# Patient Record
Sex: Female | Born: 1945 | Race: White | Hispanic: No | Marital: Married | Smoking: Never smoker
Health system: Southern US, Community
[De-identification: ages and names within clinical notes are randomized; demographics above are authoritative.]

## PROBLEM LIST (undated history)

## (undated) DIAGNOSIS — C541 Malignant neoplasm of endometrium: Secondary | ICD-10-CM

## (undated) DIAGNOSIS — M545 Low back pain, unspecified: Secondary | ICD-10-CM

## (undated) DIAGNOSIS — E785 Hyperlipidemia, unspecified: Secondary | ICD-10-CM

## (undated) DIAGNOSIS — Z8619 Personal history of other infectious and parasitic diseases: Secondary | ICD-10-CM

## (undated) DIAGNOSIS — M8580 Other specified disorders of bone density and structure, unspecified site: Secondary | ICD-10-CM

## (undated) DIAGNOSIS — K7689 Other specified diseases of liver: Secondary | ICD-10-CM

## (undated) DIAGNOSIS — Z87898 Personal history of other specified conditions: Secondary | ICD-10-CM

## (undated) DIAGNOSIS — K635 Polyp of colon: Secondary | ICD-10-CM

## (undated) HISTORY — DX: Other specified disorders of bone density and structure, unspecified site: M85.80

## (undated) HISTORY — DX: Polyp of colon: K63.5

## (undated) HISTORY — DX: Personal history of other infectious and parasitic diseases: Z86.19

## (undated) HISTORY — DX: Low back pain: M54.5

## (undated) HISTORY — PX: OOPHORECTOMY: SHX86

## (undated) HISTORY — DX: Low back pain, unspecified: M54.50

## (undated) HISTORY — DX: Other specified diseases of liver: K76.89

## (undated) HISTORY — DX: Malignant neoplasm of endometrium: C54.1

## (undated) HISTORY — DX: Hyperlipidemia, unspecified: E78.5

## (undated) HISTORY — DX: Personal history of other specified conditions: Z87.898

---

## 1994-10-05 DIAGNOSIS — C541 Malignant neoplasm of endometrium: Secondary | ICD-10-CM

## 1994-10-05 HISTORY — PX: VAGINAL HYSTERECTOMY: SUR661

## 1994-10-05 HISTORY — DX: Malignant neoplasm of endometrium: C54.1

## 1998-12-31 ENCOUNTER — Other Ambulatory Visit: Admission: RE | Admit: 1998-12-31 | Discharge: 1998-12-31 | Payer: Self-pay | Admitting: Gynecology

## 1999-07-01 ENCOUNTER — Other Ambulatory Visit: Admission: RE | Admit: 1999-07-01 | Discharge: 1999-07-01 | Payer: Self-pay | Admitting: Gynecology

## 2000-03-02 ENCOUNTER — Other Ambulatory Visit: Admission: RE | Admit: 2000-03-02 | Discharge: 2000-03-02 | Payer: Self-pay | Admitting: Gynecology

## 2000-09-09 ENCOUNTER — Other Ambulatory Visit: Admission: RE | Admit: 2000-09-09 | Discharge: 2000-09-09 | Payer: Self-pay | Admitting: Gynecology

## 2002-10-25 ENCOUNTER — Other Ambulatory Visit: Admission: RE | Admit: 2002-10-25 | Discharge: 2002-10-25 | Payer: Self-pay | Admitting: Gynecology

## 2003-10-29 ENCOUNTER — Other Ambulatory Visit: Admission: RE | Admit: 2003-10-29 | Discharge: 2003-10-29 | Payer: Self-pay | Admitting: Gynecology

## 2004-11-14 ENCOUNTER — Other Ambulatory Visit: Admission: RE | Admit: 2004-11-14 | Discharge: 2004-11-14 | Payer: Self-pay | Admitting: Gynecology

## 2005-04-28 ENCOUNTER — Ambulatory Visit (HOSPITAL_COMMUNITY): Admission: RE | Admit: 2005-04-28 | Discharge: 2005-04-28 | Payer: Self-pay | Admitting: Pulmonary Disease

## 2005-04-28 ENCOUNTER — Ambulatory Visit: Payer: Self-pay | Admitting: Pulmonary Disease

## 2005-08-31 ENCOUNTER — Ambulatory Visit: Payer: Self-pay | Admitting: Pulmonary Disease

## 2005-11-16 ENCOUNTER — Other Ambulatory Visit: Admission: RE | Admit: 2005-11-16 | Discharge: 2005-11-16 | Payer: Self-pay | Admitting: Gynecology

## 2006-02-01 ENCOUNTER — Ambulatory Visit (HOSPITAL_COMMUNITY): Admission: RE | Admit: 2006-02-01 | Discharge: 2006-02-01 | Payer: Self-pay | Admitting: Pulmonary Disease

## 2006-02-01 ENCOUNTER — Ambulatory Visit: Payer: Self-pay | Admitting: Pulmonary Disease

## 2006-02-02 ENCOUNTER — Ambulatory Visit (HOSPITAL_COMMUNITY): Admission: RE | Admit: 2006-02-02 | Discharge: 2006-02-02 | Payer: Self-pay | Admitting: Pulmonary Disease

## 2006-02-02 ENCOUNTER — Encounter: Payer: Self-pay | Admitting: Vascular Surgery

## 2006-05-25 ENCOUNTER — Ambulatory Visit: Payer: Self-pay | Admitting: Pulmonary Disease

## 2006-09-01 ENCOUNTER — Ambulatory Visit: Payer: Self-pay | Admitting: Pulmonary Disease

## 2006-09-01 LAB — CONVERTED CEMR LAB
Basophils Relative: 0.6 % (ref 0.0–1.0)
Bilirubin Urine: NEGATIVE
Chloride: 105 meq/L (ref 96–112)
Cholesterol: 171 mg/dL (ref 0–200)
Creatinine, Ser: 0.9 mg/dL (ref 0.4–1.2)
Glucose, Bld: 101 mg/dL — ABNORMAL HIGH (ref 70–99)
HCT: 45.3 % (ref 36.0–46.0)
LDL Cholesterol: 103 mg/dL — ABNORMAL HIGH (ref 0–99)
MCHC: 34 g/dL (ref 30.0–36.0)
MCV: 90.4 fL (ref 78.0–100.0)
Monocytes Absolute: 0.6 10*3/uL (ref 0.2–0.7)
Monocytes Relative: 7.4 % (ref 3.0–11.0)
Neutro Abs: 5.4 10*3/uL (ref 1.4–7.7)
Nitrite: NEGATIVE
RBC / HPF: NONE SEEN
RBC: 5.01 M/uL (ref 3.87–5.11)
RDW: 11.9 % (ref 11.5–14.6)
Specific Gravity, Urine: 1.01 (ref 1.000–1.03)
TSH: 1.29 microintl units/mL (ref 0.35–5.50)
Total Protein, Urine: NEGATIVE mg/dL
Total Protein: 7.3 g/dL (ref 6.0–8.3)
Triglyceride fasting, serum: 116 mg/dL (ref 0–149)
Urine Glucose: NEGATIVE mg/dL
VLDL: 23 mg/dL (ref 0–40)
pH: 7.5 (ref 5.0–8.0)

## 2006-11-17 ENCOUNTER — Other Ambulatory Visit: Admission: RE | Admit: 2006-11-17 | Discharge: 2006-11-17 | Payer: Self-pay | Admitting: Gynecology

## 2006-11-22 ENCOUNTER — Encounter: Payer: Self-pay | Admitting: Pulmonary Disease

## 2006-12-14 ENCOUNTER — Ambulatory Visit: Payer: Self-pay | Admitting: Pulmonary Disease

## 2006-12-14 LAB — CONVERTED CEMR LAB
Bilirubin Urine: NEGATIVE
Ketones, ur: NEGATIVE mg/dL
Nitrite: NEGATIVE
pH: 7 (ref 5.0–8.0)

## 2007-06-21 ENCOUNTER — Ambulatory Visit: Payer: Self-pay | Admitting: Pulmonary Disease

## 2007-09-06 ENCOUNTER — Encounter: Payer: Self-pay | Admitting: Pulmonary Disease

## 2007-09-16 ENCOUNTER — Telehealth (INDEPENDENT_AMBULATORY_CARE_PROVIDER_SITE_OTHER): Payer: Self-pay | Admitting: *Deleted

## 2007-10-10 ENCOUNTER — Ambulatory Visit: Payer: Self-pay | Admitting: Pulmonary Disease

## 2007-10-10 DIAGNOSIS — J309 Allergic rhinitis, unspecified: Secondary | ICD-10-CM | POA: Insufficient documentation

## 2007-10-10 DIAGNOSIS — E785 Hyperlipidemia, unspecified: Secondary | ICD-10-CM

## 2007-10-12 ENCOUNTER — Encounter: Payer: Self-pay | Admitting: Pulmonary Disease

## 2007-11-21 ENCOUNTER — Other Ambulatory Visit: Admission: RE | Admit: 2007-11-21 | Discharge: 2007-11-21 | Payer: Self-pay | Admitting: Gynecology

## 2007-12-09 ENCOUNTER — Ambulatory Visit: Payer: Self-pay | Admitting: Pulmonary Disease

## 2007-12-09 DIAGNOSIS — M545 Low back pain: Secondary | ICD-10-CM

## 2007-12-09 DIAGNOSIS — M949 Disorder of cartilage, unspecified: Secondary | ICD-10-CM

## 2007-12-09 DIAGNOSIS — K759 Inflammatory liver disease, unspecified: Secondary | ICD-10-CM | POA: Insufficient documentation

## 2007-12-09 DIAGNOSIS — M899 Disorder of bone, unspecified: Secondary | ICD-10-CM | POA: Insufficient documentation

## 2007-12-10 LAB — CONVERTED CEMR LAB
ALT: 38 units/L — ABNORMAL HIGH (ref 0–35)
Albumin: 4.2 g/dL (ref 3.5–5.2)
Alkaline Phosphatase: 101 units/L (ref 39–117)
BUN: 16 mg/dL (ref 6–23)
Bacteria, UA: NEGATIVE
Basophils Absolute: 0.1 10*3/uL (ref 0.0–0.1)
Bilirubin, Direct: 0.1 mg/dL (ref 0.0–0.3)
CO2: 31 meq/L (ref 19–32)
Chloride: 103 meq/L (ref 96–112)
GFR calc Af Amer: 93 mL/min
GFR calc non Af Amer: 77 mL/min
HCT: 43.3 % (ref 36.0–46.0)
HDL: 37.3 mg/dL — ABNORMAL LOW (ref >39.0)
Hemoglobin: 14.9 g/dL (ref 12.0–15.0)
LDL Cholesterol: 104 mg/dL — ABNORMAL HIGH (ref 0–99)
Leukocytes, UA: NEGATIVE
Lymphocytes Relative: 23.1 % (ref 12.0–46.0)
MCV: 89.2 fL (ref 78.0–100.0)
Monocytes Absolute: 0.7 10*3/uL (ref 0.2–0.7)
Mucus, UA: NEGATIVE
Nitrite: NEGATIVE
Platelets: 294 10*3/uL (ref 150–400)
Potassium: 4.1 meq/L (ref 3.5–5.1)
RBC / HPF: NONE SEEN
TSH: 1.26 microintl units/mL (ref 0.35–5.50)
Total CHOL/HDL Ratio: 4.7
Total Protein, Urine: NEGATIVE mg/dL
Total Protein: 7.1 g/dL (ref 6.0–8.3)
Triglycerides: 172 mg/dL — ABNORMAL HIGH (ref 0–149)
Urine Glucose: NEGATIVE mg/dL
Urobilinogen, UA: 0.2 (ref 0.0–1.0)
VLDL: 34 mg/dL (ref 0–40)

## 2007-12-28 ENCOUNTER — Ambulatory Visit: Payer: Self-pay | Admitting: Pulmonary Disease

## 2007-12-28 LAB — CONVERTED CEMR LAB
OCCULT 2: NEGATIVE
OCCULT 4: NEGATIVE

## 2008-04-19 ENCOUNTER — Telehealth (INDEPENDENT_AMBULATORY_CARE_PROVIDER_SITE_OTHER): Payer: Self-pay | Admitting: *Deleted

## 2008-05-09 ENCOUNTER — Encounter: Payer: Self-pay | Admitting: Pulmonary Disease

## 2008-08-01 ENCOUNTER — Ambulatory Visit: Payer: Self-pay | Admitting: Pulmonary Disease

## 2008-08-17 ENCOUNTER — Telehealth (INDEPENDENT_AMBULATORY_CARE_PROVIDER_SITE_OTHER): Payer: Self-pay | Admitting: *Deleted

## 2008-09-12 ENCOUNTER — Encounter: Payer: Self-pay | Admitting: Pulmonary Disease

## 2009-01-02 ENCOUNTER — Ambulatory Visit: Payer: Self-pay | Admitting: Pulmonary Disease

## 2009-01-02 LAB — CONVERTED CEMR LAB: Streptococcus, Group A Screen (Direct): NEGATIVE

## 2009-01-03 ENCOUNTER — Telehealth (INDEPENDENT_AMBULATORY_CARE_PROVIDER_SITE_OTHER): Payer: Self-pay | Admitting: *Deleted

## 2009-02-11 ENCOUNTER — Ambulatory Visit: Payer: Self-pay | Admitting: Gynecology

## 2009-02-11 ENCOUNTER — Other Ambulatory Visit: Admission: RE | Admit: 2009-02-11 | Discharge: 2009-02-11 | Payer: Self-pay | Admitting: Gynecology

## 2009-02-11 ENCOUNTER — Encounter: Payer: Self-pay | Admitting: Gynecology

## 2009-02-27 ENCOUNTER — Ambulatory Visit: Payer: Self-pay | Admitting: Pulmonary Disease

## 2009-03-03 LAB — CONVERTED CEMR LAB
ALT: 30 units/L (ref 0–35)
BUN: 14 mg/dL (ref 6–23)
Basophils Absolute: 0 10*3/uL (ref 0.0–0.1)
Bilirubin Urine: NEGATIVE
CO2: 30 meq/L (ref 19–32)
Calcium: 9.8 mg/dL (ref 8.4–10.5)
Creatinine, Ser: 0.7 mg/dL (ref 0.4–1.2)
Eosinophils Absolute: 0.1 10*3/uL (ref 0.0–0.7)
Glucose, Bld: 89 mg/dL (ref 70–99)
HCT: 44.6 % (ref 36.0–46.0)
Hemoglobin, Urine: NEGATIVE
Hemoglobin: 15.5 g/dL — ABNORMAL HIGH (ref 12.0–15.0)
Ketones, ur: NEGATIVE mg/dL
MCHC: 34.8 g/dL (ref 30.0–36.0)
MCV: 90.7 fL (ref 78.0–100.0)
Monocytes Absolute: 0.4 10*3/uL (ref 0.1–1.0)
Neutro Abs: 4.6 10*3/uL (ref 1.4–7.7)
Neutrophils Relative %: 59.3 % (ref 43.0–77.0)
Platelets: 277 10*3/uL (ref 150.0–400.0)
RBC: 4.92 M/uL (ref 3.87–5.11)
TSH: 1.55 microintl units/mL (ref 0.35–5.50)
Total Protein, Urine: NEGATIVE mg/dL
Triglycerides: 146 mg/dL (ref 0.0–149.0)
Urine Glucose: NEGATIVE mg/dL
Vit D, 25-Hydroxy: 33 ng/mL (ref 30–89)
WBC: 7.6 10*3/uL (ref 4.5–10.5)

## 2009-04-02 ENCOUNTER — Encounter: Payer: Self-pay | Admitting: Pulmonary Disease

## 2009-04-02 ENCOUNTER — Ambulatory Visit: Payer: Self-pay | Admitting: Gynecology

## 2009-04-17 ENCOUNTER — Telehealth: Payer: Self-pay | Admitting: Pulmonary Disease

## 2009-10-16 ENCOUNTER — Encounter: Payer: Self-pay | Admitting: Pulmonary Disease

## 2010-02-28 ENCOUNTER — Ambulatory Visit: Payer: Self-pay | Admitting: Gynecology

## 2010-02-28 ENCOUNTER — Ambulatory Visit: Payer: Self-pay | Admitting: Pulmonary Disease

## 2010-02-28 ENCOUNTER — Other Ambulatory Visit: Admission: RE | Admit: 2010-02-28 | Discharge: 2010-02-28 | Payer: Self-pay | Admitting: Gynecology

## 2010-03-01 LAB — CONVERTED CEMR LAB
ALT: 31 units/L (ref 0–35)
AST: 23 units/L (ref 0–37)
Alkaline Phosphatase: 96 units/L (ref 39–117)
BUN: 18 mg/dL (ref 6–23)
Basophils Relative: 0.4 % (ref 0.0–3.0)
Bilirubin Urine: NEGATIVE
Bilirubin, Direct: 0.1 mg/dL (ref 0.0–0.3)
CO2: 30 meq/L (ref 19–32)
Chloride: 105 meq/L (ref 96–112)
Creatinine, Ser: 0.7 mg/dL (ref 0.4–1.2)
Eosinophils Relative: 1 % (ref 0.0–5.0)
GFR calc non Af Amer: 89.15 mL/min (ref >60)
HDL: 42.2 mg/dL (ref >39.00)
Hemoglobin, Urine: NEGATIVE
Lymphocytes Relative: 25.1 % (ref 12.0–46.0)
Lymphs Abs: 2.7 10*3/uL (ref 0.7–4.0)
MCV: 92.3 fL (ref 78.0–100.0)
Monocytes Relative: 6.5 % (ref 3.0–12.0)
Nitrite: NEGATIVE
Platelets: 291 10*3/uL (ref 150.0–400.0)
Potassium: 4.3 meq/L (ref 3.5–5.1)
RBC: 4.8 M/uL (ref 3.87–5.11)
Specific Gravity, Urine: 1.025 (ref 1.000–1.030)
TSH: 1.61 microintl units/mL (ref 0.35–5.50)
Total Bilirubin: 0.5 mg/dL (ref 0.3–1.2)
Total CHOL/HDL Ratio: 4
Total Protein, Urine: NEGATIVE mg/dL
Triglycerides: 159 mg/dL — ABNORMAL HIGH (ref 0.0–149.0)
pH: 6 (ref 5.0–8.0)

## 2010-09-04 ENCOUNTER — Telehealth (INDEPENDENT_AMBULATORY_CARE_PROVIDER_SITE_OTHER): Payer: Self-pay | Admitting: *Deleted

## 2010-09-09 ENCOUNTER — Telehealth: Payer: Self-pay | Admitting: Pulmonary Disease

## 2010-09-23 ENCOUNTER — Ambulatory Visit: Payer: Self-pay | Admitting: Pulmonary Disease

## 2010-10-28 ENCOUNTER — Ambulatory Visit
Admission: RE | Admit: 2010-10-28 | Discharge: 2010-10-28 | Payer: Self-pay | Source: Home / Self Care | Attending: Pulmonary Disease | Admitting: Pulmonary Disease

## 2010-10-28 DIAGNOSIS — I10 Essential (primary) hypertension: Secondary | ICD-10-CM | POA: Insufficient documentation

## 2010-11-06 NOTE — Progress Notes (Signed)
Summary: ? reaction to losartan  Phone Note Call from Patient Call back at [facility] Phone [phone]   Caller: Patient Call For: nadel Summary of Call: Pt states she can take losartan re: severe reaction to her kidneys.//[facility] [address], [place] Initial call taken by: Darletta Moll,  September 09, 2010 8:27 AM  Follow-up for Phone Call        called spoke with patient who states that she took 1st dose of the losartan on 12.3.11 and states that she had increased and painful urination x6hours.  states she has not taken the losartan since.  states no further urinary symptoms.  would like to change meds.  please advise, thanks!  [facility]  allergies: fosamax. Follow-up by: Boone Master CNA/MA,  September 09, 2010 9:37 AM  Additional Follow-up for Phone Call Additional follow up Details #1::        per SN---very unusual ---never seen this before from losartan----rec to change to metoprolol er 50mg  once daily--and she will keep her appt in jan with SN--this has been called in to the [facility] on [address]---[phone]. Randell Loop CMA  September 09, 2010 4:16 PM     New/Updated Medications: METOPROLOL TARTRATE 50 MG TABS (METOPROLOL TARTRATE) take one tablet by mouth once daily

## 2010-11-06 NOTE — Assessment & Plan Note (Signed)
Summary: cough,congestion/mhh   Visit Type:  NP sick visit Primary Provider/Referring Provider:  Alroy Dust, MD  CC:  Pt c/o productive cough with small amounts of thick yellow mucus, wheezing, epigastric pain only when coughing, headaches. Has been using OTC sudafed, nedi pot, Mucinex DM, NyQuil PM, and cough drops with temp relief.  History of Present Illness: 65 y/o WF  with known history of Hyperlipidemia and osteopenia.   January 07, 2009--Presents for an acute office visit. Complains of sore throat on left side x2 days . Complains of nasal stuffiness, mild drainage. Denies chest pain, dyspnea, orthopnea, hemoptysis, fever, n/v/d, edema, headache,recent travel or antibiotics, no discolored mucous. No otc used.   September 23, 2010--Presents for an acute office visit. Complains of productive cough with small amounts of thick yellow mucus, wheezing, epigastric pain only when coughing, headaches. Has been using OTC sudafed, nedi pot, Mucinex DM, NyQuil PM, cough drops with temp relief. Does not want to be sick for the holidays.Denies chest pain, dyspnea, orthopnea, hemoptysis, fever, n/v/d, edema, headache,recent travel or antibiotics.    Preventive Screening-Counseling & Management  Alcohol-Tobacco     Smoking Status: never  Current Medications (verified): 1)  Low-Dose Aspirin 81 Mg  Tabs (Aspirin) .... Take 1 Tablet By Mouth Once A Day 2)  Simvastatin 40 Mg Tabs (Simvastatin) .... Take 1 Tablet By Mouth At Bedtime 3)  Caltrate 600+d 600-400 Mg-Unit  Tabs (Calcium Carbonate-Vitamin D) .... Take 1 Tab By Mouth Two Times A Day... 4)  Multivitamins   Tabs (Multiple Vitamin) .... Take 1 Tablet By Mouth Once A Day 5)  Vitamin D 1000 Unit Tabs (Cholecalciferol) .... Take 1 Cap By Mouth Once Daily.Marland KitchenMarland Kitchen 6)  Metoprolol Tartrate 50 Mg Tabs (Metoprolol Tartrate) .... Take One Tablet By Mouth Once Daily  Allergies: 1)  ! Fosamax (Alendronate Sodium) 2)  ! Losartan Potassium (Losartan  Potassium)  Past History:  Past Medical History: Last updated: 02/28/2010 ALLERGIC RHINITIS (ICD-477.9) Hx of DIZZINESS (ICD-780.4) HYPERLIPIDEMIA (ICD-272.4) Hx of HEPATITIS (ICD-573.3) Hx of LUMBAGO (ICD-724.2) OSTEOPENIA (ICD-733.90)  Past Surgical History: Last updated: 02/28/2010 S/P hysterectomy  Family History: Last updated: 2009-03-27 Father died age 88 from cancer- ?type ("he had a wen on his arm that turned into cancer") Mother alive age 38 in good health x detached retina 1 Sibling= Sister w/ hx breast cancer  Social History: Last updated: 2009/03/27 Married, husb= Perlie Gold, 44 yrs 1 child= son age 90 in good health never smoked social alcohol retired from [employer]  Risk Factors: Smoking Status: never (09/23/2010)  Review of Systems      See HPI  Vital Signs:  Patient profile:   65 year old female Height:      62.5 inches Weight:      188 pounds BMI:     33.96 O2 Sat:      95 % on Room air Temp:     99.5 degrees F oral Pulse rate:   71 / minute BP sitting:   118 / 78  (left arm) Cuff size:   large  Vitals Entered By: Zackery Barefoot CMA (September 23, 2010 11:39 AM)  O2 Flow:  Room air CC: Pt c/o productive cough with small amounts of thick yellow mucus, wheezing, epigastric pain only when coughing, headaches. Has been using OTC sudafed, nedi pot, Mucinex DM, NyQuil PM, cough drops with temp relief Is Patient Diabetic? No Comments Medications reviewed with patient Verified contact number and pharmacy with patient Zackery Barefoot [facility]  September 23, 2010 11:39  AM    Physical Exam  Additional Exam:  WD, Overweight, 65 y/o WF in NAD... GENERAL:  Alert & oriented; pleasant & cooperative. HEENT:  /AT, EOM-wnl,  EACs-clear, TMs-wnl, NOSE-clear, THROAT-mild redness, no exudate NECK:  Supple w/ full ROM; no JVD; normal carotid impulses w/o bruits; no thyromegaly or nodules palpated; no lymphadenopathy. CHEST:  Coarse BS w/ no wheezing  HEART:   Regular Rhythm; without murmurs/ rubs/ or gallops. ABDOMEN:  Soft & nontender; normal bowel sounds; no organomegaly or masses detected. EXT: without deformities or arthritic changes; no varicose veins/ +venous insuffic/ no edema.     Impression & Recommendations:  Problem # 1:  BRONCHITIS, ACUTE (ICD-466.0)  Zithromax 500mg  once daily for 5 days  Mucinex DM two times a day as needed cough/congestion  Increase fluids and rest.  Saline nasal rinses as needed  Hydromet 1-2 tsp every 4-6 hr as needed cough -may make you sleepy Please contact office for sooner follow up if symptoms do not improve or worsen  Her updated medication list for this problem includes:    Azithromycin 500 Mg Tabs (Azithromycin) .Marland Kitchen... 1 by mouth once daily    Hydromet 5-1.5 Mg/98ml Syrp (Hydrocodone-homatropine) .Marland Kitchen... 1-2 tsp every 4-6 hr as needed cough  Orders: Est. Patient Level III (40981)  Medications Added to Medication List This Visit: 1)  Azithromycin 500 Mg Tabs (Azithromycin) .Marland Kitchen.. 1 by mouth once daily 2)  Hydromet 5-1.5 Mg/48ml Syrp (Hydrocodone-homatropine) .Marland Kitchen.. 1-2 tsp every 4-6 hr as needed cough 3)  Azithromycin 500 Mg Tabs (Azithromycin) .Marland Kitchen.. 1 by mouth once daily  Complete Medication List: 1)  Low-dose Aspirin 81 Mg Tabs (Aspirin) .... Take 1 tablet by mouth once a day 2)  Simvastatin 40 Mg Tabs (Simvastatin) .... Take 1 tablet by mouth at bedtime 3)  Caltrate 600+d 600-400 Mg-unit Tabs (Calcium carbonate-vitamin d) .... Take 1 tab by mouth two times a day... 4)  Multivitamins Tabs (Multiple vitamin) .... Take 1 tablet by mouth once a day 5)  Vitamin D 1000 Unit Tabs (Cholecalciferol) .... Take 1 cap by mouth once daily.Marland KitchenMarland Kitchen 6)  Metoprolol Tartrate 50 Mg Tabs (Metoprolol tartrate) .... Take one tablet by mouth once daily 7)  Azithromycin 500 Mg Tabs (Azithromycin) .Marland Kitchen.. 1 by mouth once daily 8)  Hydromet 5-1.5 Mg/20ml Syrp (Hydrocodone-homatropine) .Marland Kitchen.. 1-2 tsp every 4-6 hr as needed cough 9)   Azithromycin 500 Mg Tabs (Azithromycin) .Marland Kitchen.. 1 by mouth once daily  Patient Instructions: 1)  Zithromax 500mg  once daily for 5 days  2)  Mucinex DM two times a day as needed cough/congestion  3)  Increase fluids and rest.  4)  Saline nasal rinses as needed  5)  Hydromet 1-2 tsp every 4-6 hr as needed cough -may make you sleepy 6)  Please contact office for sooner follow up if symptoms do not improve or worsen  Prescriptions: AZITHROMYCIN 500 MG TABS (AZITHROMYCIN) 1 by mouth once daily  #5 x 0   Entered and Authorized by:   Rubye Oaks NP   Signed by:   Keron Neenan NP on 09/23/2010   Method used:   Print then Give to Patient   RxID:   [id number] HYDROMET 5-1.5 MG/5ML SYRP (HYDROCODONE-HOMATROPINE) 1-2 tsp every 4-6 hr as needed cough  #8 oz x 0   Entered and Authorized by:   Rubye Oaks NP   Signed by:   Ellijah Leffel NP on 09/23/2010   Method used:   Print then Give to Patient   RxID:   [id number]  AZITHROMYCIN 500 MG TABS (AZITHROMYCIN) 1 by mouth once daily  #5 x 0   Entered and Authorized by:   Rubye Oaks NP   Signed by:   Rubye Oaks NP on 09/23/2010   Method used:   Electronically to        [facility] [phone]* (retail)       [address]       [place], [place]  [address]       Ph: [phone]       Fax: [phone]   RxID:   [id number]

## 2010-11-06 NOTE — Assessment & Plan Note (Signed)
Summary: 1 yr physical ///kp   CC:  Yearly ROV & CPX....  History of Present Illness: 65 y/o WF here for a follow up visit and CPX... she has been doing well and has no new complaints or concerns today... she notes some intermittent heel pain improved w/ inserts & stretching exercises...   Current Problems:   ALLERGIC RHINITIS (ICD-477.9) - stable, no recent problems, OTC Rx w/ Zyrtek/ Claritin Prn.  Hx of DIZZINESS (ICD-780.4) - no recent problems...  HYPERLIPIDEMIA (ICD-272.4) - on SIMVASTATIN 40mg /d now... tol well.  ~  FLP 11/07 on Lipitor 20mg /d showed TChol 171, TG 116, HDL 45, LDL 103  ~  FLP 3/09 on Simva40 showed TChol 176, TG 172, HDL 37, LDL 104  ~  FLP 5/10 (wt=189#) on Simva40 showed TChol 181, TG 146, HDL 43, LDL 109  ~  FLP 5/11 (wt=189#) on Simva40 showed TChol 172, TG 159, HDL 42, LDL 98  Hx of HEPATITIS (ICD-573.3) - hx jaundice in 8th grade "alot of kids had it"... ? etiology (?HepA?), no known sequellae... all LFT's have been normal...  Hx of LUMBAGO (ICD-724.2) - seen 3/08 w/ LBP... rx'd w/ Robaxin, heat, Vicodin & improved... does exercises to keep in shape... no recent LBP complaints.  OSTEOPENIA (ICD-733.90) - followed by DrFontaine and [facility]... we don't have a copy of her last BMD, but in 2006 her TScores were -1.2 to -2.0.Marland KitchenMarland Kitchen she was intol to Fosamax w/ joint pain that resolved off this Rx... DrFontaine has opted for Ca++, Vits, and observation... we discussed Osteoporosis in detail and all the diff meds and implications for the future... she may wish to try boniva & will discuss this w/ DrFontaine.  ~  labs 3/09 showed Vit D level = 28... rec> start 1000 u OTC daily.  ~  labs 5/10 showed Vit D level = 33... continue supplement.  DERM:  Hx of plantar wart, and poison ivy sensitivity...   Health Maintenance:    ~  GI:  she had routine colonoscopy 11/02 by DrPatterson and it was WNL... f/u planned 65yrs...   ~  GYN:  DrFontaine-  Hx cervical cancer w/  hysterectomy 1996;   +fam hx breast cancer in sis, neg Mammogram 12/09;  BMD at [facility] & followed by GYN/ Fontaine.  ~  Vaccinations:  she gets yearly Flu vaccines;  Pneumovax given 02/27/09;  given TDAP 02/28/10 here;  had Shingles vacc 8/09 at [facility] w/ local reaction on arm...   Allergies: 1)  ! Fosamax (Alendronate Sodium)  Comments:  Nurse/Medical Assistant: The patient's medications and allergies were reviewed with the patient and were updated in the Medication and Allergy Lists.  Past History:  Past Medical History: ALLERGIC RHINITIS (ICD-477.9) Hx of DIZZINESS (ICD-780.4) HYPERLIPIDEMIA (ICD-272.4) Hx of HEPATITIS (ICD-573.3) Hx of LUMBAGO (ICD-724.2) OSTEOPENIA (ICD-733.90)  Past Surgical History: S/P hysterectomy  Family History: Reviewed history from 02/27/2009 and no changes required. Father died age 63 from cancer- ?type ("he had a wen on his arm that turned into cancer") Mother alive age 54 in good health x detached retina 1 Sibling= Sister w/ hx breast cancer  Social History: Reviewed history from 02/27/2009 and no changes required. Married, husb= Perlie Gold, 44 yrs 1 child= son age 64 in good health never smoked social alcohol retired from [employer]  Review of Systems  The patient denies fever, chills, sweats, anorexia, fatigue, weakness, malaise, weight loss, sleep disorder, blurring, diplopia, eye irritation, eye discharge, vision loss, eye pain, photophobia, earache, ear discharge, tinnitus, decreased  hearing, nasal congestion, nosebleeds, sore throat, hoarseness, chest pain, palpitations, syncope, dyspnea on exertion, orthopnea, PND, peripheral edema, cough, dyspnea at rest, excessive sputum, hemoptysis, wheezing, pleurisy, nausea, vomiting, diarrhea, constipation, change in bowel habits, abdominal pain, melena, hematochezia, jaundice, gas/bloating, indigestion/heartburn, dysphagia, odynophagia, dysuria, hematuria, urinary frequency, urinary hesitancy,  nocturia, incontinence, back pain, joint pain, joint swelling, muscle cramps, muscle weakness, stiffness, arthritis, sciatica, restless legs, leg pain at night, leg pain with exertion, rash, itching, dryness, suspicious lesions, paralysis, paresthesias, seizures, tremors, vertigo, transient blindness, frequent falls, frequent headaches, difficulty walking, depression, anxiety, memory loss, confusion, cold intolerance, heat intolerance, polydipsia, polyphagia, polyuria, unusual weight change, abnormal bruising, bleeding, enlarged lymph nodes, urticaria, allergic rash, hay fever, and recurrent infections.    Vital Signs:  Patient profile:   65 year old female Height:      62.5 inches Weight:      189 pounds BMI:     34.14 O2 Sat:      97 % on Room air Temp:     97.2 degrees F oral Pulse rate:   62 / minute BP sitting:   128 / 84  (left arm) Cuff size:   regular  Vitals Entered By: Randell Loop CMA (Feb 28, 2010 8:38 AM)  O2 Sat at Rest %:  97 O2 Flow:  Room air CC: Yearly ROV & CPX... Is Patient Diabetic? No Pain Assessment Patient in pain? no      Comments no changes in meds today   Physical Exam  Additional Exam:  WD, Overweight, 65 y/o WF in NAD... GENERAL:  Alert & oriented; pleasant & cooperative. HEENT:  [place]/AT, EOM-wnl, PERRLA, EACs-clear, TMs-wnl, NOSE-clear, THROAT-clear & wnl. NECK:  Supple w/ full ROM; no JVD; normal carotid impulses w/o bruits; no thyromegaly or nodules palpated; no lymphadenopathy. CHEST:  Clear to P & A; without wheezes/ rales/ or rhonchi. HEART:  Regular Rhythm; without murmurs/ rubs/ or gallops. ABDOMEN:  Soft & nontender; normal bowel sounds; no organomegaly or masses detected. EXT: without deformities or arthritic changes; no varicose veins/ +venous insuffic/ or edema. NEURO:  CN's intact; motor testing normal; sensory testing normal; gait normal & balance OK. DERM:  No lesions noted; no rash etc...    CXR  Procedure date:   02/28/2010  Findings:      CHEST - 2 VIEW Comparison: 02/27/2009   Findings: Heart size upper normal.  Vascularity normal.  There is no infiltrate, effusion, or mass lesion.  There is no heart failure.  No significant interval change.   IMPRESSION: No acute cardiopulmonary disease.   Read By:  Camelia Phenes,  M.D.   EKG  Procedure date:  02/28/2010  Findings:      Normal sinus rhythm with rate of:  64/min... Tracing shows poor r prog V1-3, no acute changes... No change from older EKG's going back over 10 yrs...  SN    MISC. Report  Procedure date:  02/28/2010  Findings:      BMP (METABOL)   Sodium                    141 mEq/L                   135-145   Potassium                 4.3 mEq/L                   3.5-5.1   Chloride  105 mEq/L                   96-112   Carbon Dioxide            30 mEq/L                    19-32   Glucose                   92 mg/dL                    04-54   BUN                       18 mg/dL                    0-98   Creatinine                0.7 mg/dL                   1.1-9.1   Calcium                   9.5 mg/dL                   4.7-82.9   GFR                       89.15 mL/min                >60  Hepatic/Liver Function Panel (HEPATIC)   Total Bilirubin           0.5 mg/dL                   5.6-2.1   Direct Bilirubin          0.1 mg/dL                   3.0-8.6   Alkaline Phosphatase      96 U/L                      39-117   AST                       23 U/L                      0-37   ALT                       31 U/L                      0-35   Total Protein             7.3 g/dL                    5.7-8.4   Albumin                   4.3 g/dL                    6.9-6.2  CBC Platelet w/Diff (CBCD)   White Cell Count     [H]  10.8 K/uL                   4.5-10.5   Red Cell Count  4.80 Mil/uL                 3.87-5.11   Hemoglobin           [H]  15.3 g/dL                   16.1-09.6   Hematocrit                 44.3 %                      36.0-46.0   MCV                       92.3 fl                     78.0-100.0   Platelet Count            291.0 K/uL                  150.0-400.0   Neutrophil %              67.0 %                      43.0-77.0   Lymphocyte %              25.1 %                      12.0-46.0   Monocyte %                6.5 %                       3.0-12.0   Eosinophils%              1.0 %                       0.0-5.0   Basophils %               0.4 %                       0.0-3.0  Comments:      Lipid Panel (LIPID)   Cholesterol               172 mg/dL                   0-454   Triglycerides        [H]  159.0 mg/dL                 0.9-811.9   HDL                       14.78 mg/dL                 >29.56   LDL Cholesterol           98 mg/dL                    2-13   TSH (TSH)   FastTSH                   1.61 uIU/mL                 0.35-5.50   UDip w/Micro (URINE)  Color                     DK. YELLOW   Clarity                   CLEAR                       Clear   Specific Gravity          1.025                       1.000 - 1.030   Urine Ph                  6.0                         5.0-8.0   Protein                   NEGATIVE                    Negative   Urine Glucose             NEGATIVE                    Negative   Ketones                   NEGATIVE                    Negative   Urine Bilirubin           NEGATIVE                    Negative   Blood                     NEGATIVE                    Negative   Urobilinogen              0.2                         0.0 - 1.0   Leukocyte Esterace        TRACE                       Negative   Nitrite                   NEGATIVE                    Negative   Urine WBC                 0-2/hpf                     0-2/hpf   Urine Epith               Rare(0-4/hpf)               Rare(0-4/hpf)   Urine Bacteria            Rare(<10/hpf)               None    Impression & Recommendations:  Problem # 1:  PHYSICAL EXAMINATION  (ICD-V70.0)  Orders: EKG  w/ Interpretation (93000) T-2 View CXR (71020TC) TLB-BMP (Basic Metabolic Panel-BMET) (80048-METABOL) TLB-Hepatic/Liver Function Pnl (80076-HEPATIC) TLB-CBC Platelet - w/Differential (85025-CBCD) TLB-Lipid Panel (80061-LIPID) TLB-TSH (Thyroid Stimulating Hormone) (84443-TSH) TLB-Udip w/ Micro (81001-URINE)  Problem # 2:  ALLERGIC RHINITIS (ICD-477.9) No prob w/ OTC antihist Prn...  Problem # 3:  HYPERLIPIDEMIA (ICD-272.4) Stable on the Simva40... we discussed diet, exercise, need to lose 40+ lbs in 20# increments!!! Her updated medication list for this problem includes:    Simvastatin 40 Mg Tabs (Simvastatin) .Marland Kitchen... Take 1 tablet by mouth at bedtime  Orders: Prescription Created Electronically [phone])  Problem # 4:  Hx of LUMBAGO (ICD-724.2) Denies LBP etc... rec incr exercise... Her updated medication list for this problem includes:    Low-dose Aspirin 81 Mg Tabs (Aspirin) .Marland Kitchen... Take 1 tablet by mouth once a day  Problem # 5:  OSTEOPENIA (ICD-733.90) Followed by DrFontaine, GYN...  Complete Medication List: 1)  Low-dose Aspirin 81 Mg Tabs (Aspirin) .... Take 1 tablet by mouth once a day 2)  Simvastatin 40 Mg Tabs (Simvastatin) .... Take 1 tablet by mouth at bedtime 3)  Caltrate 600+d 600-400 Mg-unit Tabs (Calcium carbonate-vitamin d) .... Take 1 tab by mouth two times a day... 4)  Multivitamins Tabs (Multiple vitamin) .... Take 1 tablet by mouth once a day 5)  Vitamin D 1000 Unit Tabs (Cholecalciferol) .... Take 1 cap by mouth once daily...  Other Orders: Tdap => 65yrs IM (34742) Admin 1st Vaccine (59563)  Patient Instructions: 1)  Today we updated your med list- see below.... 2)  We refilled your Simvastatin.Marland KitchenMarland Kitchen 3)  Today we did your follow up CXR, EKG, & FASTING blood work... please call the "phone tree" in a few days for your lab results.Marland KitchenMarland Kitchen 4)  Let's get on track w/ our diet & exercise program... 5)  Call for any problems.Marland KitchenMarland Kitchen 6)  Please  schedule a follow-up appointment in 1 year, sooner as needed. Prescriptions: SIMVASTATIN 40 MG TABS (SIMVASTATIN) Take 1 tablet by mouth at bedtime  #90 x 4   Entered and Authorized by:   Michele Mcalpine MD   Signed by:   Michele Mcalpine MD on 02/28/2010   Method used:   Print then Give to Patient   RxID:   [id number]    [facility]io[employer] ECG  ID: [id number] Patient: Margaret Evans, Margaret Evans DOB: Mar 01, 1946 Age: 65 Years Old Sex: Female Race: White Physician: Yarelis Ambrosino Technician: Randell Loop CMA Height: 62.5 Weight: 189 Status: Unconfirmed Past Medical History:  ALLERGIC RHINITIS (ICD-477.9) Hx of DIZZINESS (ICD-780.4) HYPERLIPIDEMIA (ICD-272.4) Hx of HEPATITIS (ICD-573.3) Hx of LUMBAGO (ICD-724.2) OSTEOPENIA (ICD-733.90)   Recorded: 02/28/2010 08:52 AM P/PR: 103 ms / 155 ms - Heart rate (maximum exercise) QRS: 100 QT/QTc/QTd: 443 ms / 448 ms / 50 ms - Heart rate (maximum exercise)  P/QRS/T axis: 73 deg / 88 deg / 61 deg - Heart rate (maximum exercise)  Heartrate: 63 bpm  Interpretation:  Normal sinus rhythm with rate of:  64/min... Tracing shows poor r prog V1-3, no acute changes... No change from older EKG's going back over 10 yrs...  SN     Immunizations Administered:  Tetanus Vaccine:    Vaccine Type: Tdap    Site: left deltoid    Mfr: boostrix    Dose: 0.5 ml    Route: IM    Given by: Randell Loop CMA    Exp. Date: 12/28/2011    Lot #: [id number]    VIS given: 08/23/07 version given Feb 28, 2010.

## 2010-11-06 NOTE — Assessment & Plan Note (Signed)
Summary: 8 week f/u losartan start///JJ   Primary Care Provider:  Alroy Dust, MD  CC:  8 month ROV & review of mult medical problems....  History of Present Illness: 65 y/o WF here for a follow up visit...    ~  May11:  she has been doing well and has no new complaints or concerns today... she notes some intermittent heel pain improved w/ inserts & stretching exercises...   ~  October 28, 2010:  Gratia callled in Dec w/ elev BP at home in the 150-160 range assoc w/ mild HA... we perscribed Losartan 50mg  but she developed urinary symptom w/ burning discomfort that she related to the new Rx so we switched her to METOPROLOL 50mg /d... pharm filled the tartrate which she's been taking once daily in AM & her nadir level BP's have all been good> 120-140/ 70 at home (we discussed this & OK to continue same & monitor BP in eve as well)...   she also had URI/ AB 12/11 & saw TP w/ ZPak, Mucinex, Hydromet Rx & resolved...   Current Problems:   ALLERGIC RHINITIS (ICD-477.9) - stable, no recent problems, OTC Rx w/ Zyrtek/ Claritin Prn.  Hx of DIZZINESS (ICD-780.4) - no recent problems...  HYPERTENSION (ICD-401.9) - new dx 12/11 w/ home BP checks  ~150-160 sys & assoc mild HA... she was tried on Losartan 50mg  w/ urinary burning symptoms that she thought was a side effect- changed to METOPROLOL 50mg /d & improved...  ~  1/12:  BP = 134/70, tol med well, and denies HA, CP, palipit, dizziness, syncope, dyspnea, edema, etc.   HYPERLIPIDEMIA (ICD-272.4) - on SIMVASTATIN 40mg /d now... tol well.  ~  FLP 11/07 on Lipitor 20mg /d showed TChol 171, TG 116, HDL 45, LDL 103  ~  FLP 3/09 on Simva40 showed TChol 176, TG 172, HDL 37, LDL 104  ~  FLP 5/10 (wt=189#) on Simva40 showed TChol 181, TG 146, HDL 43, LDL 109  ~  FLP 5/11 (wt=189#) on Simva40 showed TChol 172, TG 159, HDL 42, LDL 98  Hx of HEPATITIS (ICD-573.3) - hx jaundice in 8th grade "alot of kids had it"... ? etiology (?HepA?), no known sequellae... all  LFT's have been normal...  Hx of LUMBAGO (ICD-724.2) - seen 3/08 w/ LBP... rx'd w/ Robaxin, heat, Vicodin & improved... does exercises to keep in shape... no recent LBP complaints.  OSTEOPENIA (ICD-733.90) - followed by DrFontaine and [facility]... we don't have a copy of her last BMD, but in 2006 her TScores were -1.2 to -2.0.Marland KitchenMarland Kitchen she was intol to Fosamax w/ joint pain that resolved off this Rx... DrFontaine has opted for Ca++, Vits, and observation... we discussed Osteoporosis in detail and all the diff meds and implications for the future... she may wish to try boniva & will discuss this w/ DrFontaine.  ~  labs 3/09 showed Vit D level = 28... rec> start 1000 u OTC daily.  ~  labs 5/10 showed Vit D level = 33... continue supplement.  ~  1/12:  she reports 66 y/o sis had "fosamax fx" after being on it just 36yrs.  DERM:  Hx of plantar wart, and poison ivy sensitivity...   Health Maintenance:    ~  GI:  she had routine colonoscopy 11/02 by DrPatterson and it was WNL... f/u planned 68yrs...   ~  GYN:  DrFontaine-  Hx cervical cancer w/ hysterectomy 1996;   +fam hx breast cancer in sis, neg Mammogram 12/09;  BMD at [facility] & followed by GYN/ Fontaine.  ~  Vaccinations:  she gets yearly Flu vaccines;  Pneumovax given 02/27/09;  given TDAP 02/28/10 here;  had Shingles vacc 8/09 at [facility] w/ local reaction on arm...   Preventive Screening-Counseling & Management  Alcohol-Tobacco     Smoking Status: never  Allergies: 1)  ! Fosamax (Alendronate Sodium) 2)  ! Losartan Potassium (Losartan Potassium)  Comments:  Nurse/Medical Assistant: The patient's medications and allergies were reviewed with the patient and were updated in the Medication and Allergy Lists.  Past History:  Past Medical History: ALLERGIC RHINITIS (ICD-477.9) Hx of DIZZINESS (ICD-780.4) HYPERTENSION (ICD-401.9) HYPERLIPIDEMIA (ICD-272.4) Hx of HEPATITIS (ICD-573.3) Hx of LUMBAGO (ICD-724.2) OSTEOPENIA (ICD-733.90)  Past Surgical  History: S/P hysterectomy  Family History: Reviewed history from 02/27/2009 and no changes required. Father died age 13 from cancer- ?type ("he had a wen on his arm that turned into cancer") Mother alive age 87 in good health x detached retina 1 Sibling= Sister w/ hx breast cancer  Social History: Reviewed history from 02/27/2009 and no changes required. Married, husb= Perlie Gold, 44 yrs 1 child= son age 3 in good health never smoked social alcohol retired from [employer]  Review of Systems      See HPI  The patient denies anorexia, fever, weight loss, weight gain, vision loss, decreased hearing, hoarseness, chest pain, syncope, dyspnea on exertion, peripheral edema, prolonged cough, headaches, hemoptysis, abdominal pain, melena, hematochezia, severe indigestion/heartburn, hematuria, incontinence, muscle weakness, suspicious skin lesions, transient blindness, difficulty walking, depression, unusual weight change, abnormal bleeding, enlarged lymph nodes, and angioedema.    Vital Signs:  Patient profile:   65 year old female Height:      62.5 inches Weight:      187.13 pounds BMI:     33.80 O2 Sat:      97 % on Room air Temp:     97.6 degrees F oral Pulse rate:   59 / minute BP sitting:   134 / 70  (left arm) Cuff size:   regular  Vitals Entered By: Randell Loop CMA (October 28, 2010 9:48 AM)  O2 Sat at Rest %:  97 O2 Flow:  Room air CC: 8 month ROV & review of mult medical problems... Is Patient Diabetic? No Pain Assessment Patient in pain? no      Comments meds updated today with pt   Physical Exam  Additional Exam:  WD, Overweight, 65 y/o WF in NAD... GENERAL:  Alert & oriented; pleasant & cooperative. HEENT:  [place]/AT, EOM-wnl, PERRLA, EACs-clear, TMs-wnl, NOSE-clear, THROAT-clear & wnl. NECK:  Supple w/ full ROM; no JVD; normal carotid impulses w/o bruits; no thyromegaly or nodules palpated; no lymphadenopathy. CHEST:  Clear to P & A; without wheezes/ rales/ or  rhonchi. HEART:  Regular Rhythm; without murmurs/ rubs/ or gallops. ABDOMEN:  Soft & nontender; normal bowel sounds; no organomegaly or masses detected. EXT: without deformities or arthritic changes; no varicose veins/ +venous insuffic/ or edema. NEURO:  CN's intact; motor testing normal; sensory testing normal; gait normal & balance OK. DERM:  No lesions noted; no rash etc...    Impression & Recommendations:  Problem # 1:  HYPERTENSION (ICD-401.9) Controlled on Metop 50> using the tartrate once daily & AM BP well controlled... continue to monitor. Her updated medication list for this problem includes:    Metoprolol Tartrate 50 Mg Tabs (Metoprolol tartrate) .Marland Kitchen... Take one tablet by mouth once daily  Problem # 2:  BRONCHITIS, ACUTE (ICD-466.0) Resolved w/ Rx 12/11 as noted... clear now. The following medications were removed from  the medication list:    Azithromycin 500 Mg Tabs (Azithromycin) .Marland Kitchen... 1 by mouth once daily    Hydromet 5-1.5 Mg/97ml Syrp (Hydrocodone-homatropine) .Marland Kitchen... 1-2 tsp every 4-6 hr as needed cough  Problem # 3:  HYPERLIPIDEMIA (ICD-272.4) Stable on Simva40 + diet... Her updated medication list for this problem includes:    Simvastatin 40 Mg Tabs (Simvastatin) .Marland Kitchen... Take 1 tablet by mouth at bedtime  Problem # 4:  OSTEOPENIA (ICD-733.90) Followed by Drfontaine/ GYN on calcium, MVI, Vit D...  Complete Medication List: 1)  Low-dose Aspirin 81 Mg Tabs (Aspirin) .... Take 1 tablet by mouth once a day 2)  Metoprolol Tartrate 50 Mg Tabs (Metoprolol tartrate) .... Take one tablet by mouth once daily 3)  Simvastatin 40 Mg Tabs (Simvastatin) .... Take 1 tablet by mouth at bedtime 4)  Caltrate 600+d 600-400 Mg-unit Tabs (Calcium carbonate-vitamin d) .... Take 1 tab by mouth two times a day... 5)  Multivitamins Tabs (Multiple vitamin) .... Take 1 tablet by mouth once a day 6)  Vitamin D 1000 Unit Tabs (Cholecalciferol) .... Take 1 cap by mouth once daily...  Patient  Instructions: 1)  Today we updated your med list- see below.... 2)  We refilled your meds for 90d supplies.Marland KitchenMarland Kitchen 3)  Continue your current meds as discussed... 4)  Call for any problems.Marland KitchenMarland Kitchen 5)  Let's put off your yearly check w/ FASTING blood work til [employer]... Prescriptions: METOPROLOL TARTRATE 50 MG TABS (METOPROLOL TARTRATE) take one tablet by mouth once daily  #90 x 4   Entered and Authorized by:   Michele Mcalpine MD   Signed by:   Michele Mcalpine MD on 10/28/2010   Method used:   Print then Give to Patient   RxID:   [id number] SIMVASTATIN 40 MG TABS (SIMVASTATIN) Take 1 tablet by mouth at bedtime  #90 x 4   Entered and Authorized by:   Michele Mcalpine MD   Signed by:   Michele Mcalpine MD on 10/28/2010   Method used:   Print then Give to Patient   RxID:   [id number]    Immunization History:  Influenza Immunization History:    Influenza:  historical (06/16/2010)

## 2010-11-06 NOTE — Progress Notes (Signed)
Summary: increased BP and headache > start cozaar 50mg   Phone Note Call from Patient   Caller: pt Call For: Margaret Evans Summary of Call: Spoke with pt and she states her BP has been reading high over the last few weeks.  on 08-18-10  her pressure was 150-82, 09-01-10 it was 156/74, and today it was 136/76. Pt also c/o having a slight headache as well. Pt last seen in 02-2010. According to the pt she is not on any BP meds at this time.  Please advise. Carron Curie CMA  September 04, 2010 4:01 PM  [facility] [address] [facility] [place] Initial call taken by: Carron Curie CMA,  September 04, 2010 4:01 PM  Follow-up for Phone Call        per SN: she needs to start a Step 1 Antihypertensive med.  rec: losartan 50mg , #30.  sig: 1 by mouth once daily.  may have 30 or 90day supply, refill as needed.  needs rov w/ me in 6-8weeks. Boone Master CNA/MA  September 04, 2010 5:21 PM   called spoke with patient.  advised of SN's recs as stated above.  pt verbalized her understanding.  [facility] [place].  appt scheduled w/ SN 1.24.12 @ 1000.  pt okay with this date and time. Boone Master CNA/MA  September 04, 2010 5:27 PM       New/Updated Medications: LOSARTAN POTASSIUM 50 MG TABS (LOSARTAN POTASSIUM) Take 1 tablet by mouth once a day Prescriptions: LOSARTAN POTASSIUM 50 MG TABS (LOSARTAN POTASSIUM) Take 1 tablet by mouth once a day  #90 x 4   Entered by:   Boone Master CNA/MA   Authorized by:   Michele Mcalpine MD   Signed by:   Boone Master CNA/MA on 09/04/2010   Method used:   Electronically to        [facility] [phone]* (retail)       [address]       [place], [place]  [address]       Ph: [phone]       Fax: [phone]   RxID:   [id number]

## 2010-11-14 ENCOUNTER — Telehealth: Payer: Self-pay | Admitting: Pulmonary Disease

## 2010-11-20 NOTE — Progress Notes (Signed)
Summary: meds question  Phone Note Call from Patient   Caller: Patient Call For: Margaret Evans Summary of Call: pt has a question re: metoprolol. call cell # [phone] Initial call taken by: Tivis Ringer, CNA,  November 14, 2010 10:30 AM  Follow-up for Phone Call        Pt states metoprolol looks different this time. The first RX was metoprolol ER and this RX is metoprolol Tartrate. Pls clarify which is correct for this patient.Michel Bickers CMA  November 14, 2010 11:17 AM  PER SN, per our med list at last OV she was on the tartrate which is what we refilled. The tartrate is a twice a day medication because it is shorter acting. She can continue to take once daily but needs to be checking her BP at night. If nighttime readings are elevated she will need to take this medication twice daily or we can send another Rx for metoprolol succ 50mg  once daily.Michel Bickers [facility]  November 14, 2010 4:29 PM  Additional Follow-up for Phone Call Additional follow up Details #1::        Pt advised of recs and she will take metoprolol tartrate once daily and check BP. Carron Curie CMA  November 14, 2010 4:42 PM

## 2011-01-06 ENCOUNTER — Encounter: Payer: Self-pay | Admitting: Pulmonary Disease

## 2011-02-20 ENCOUNTER — Encounter: Payer: Self-pay | Admitting: Pulmonary Disease

## 2011-02-27 ENCOUNTER — Ambulatory Visit: Payer: Self-pay | Admitting: Pulmonary Disease

## 2011-03-13 ENCOUNTER — Other Ambulatory Visit (HOSPITAL_COMMUNITY)
Admission: RE | Admit: 2011-03-13 | Discharge: 2011-03-13 | Disposition: A | Discharge status: Home / Self Care | Payer: Medicare Other | Source: Ambulatory Visit | Attending: Gynecology | Admitting: Gynecology

## 2011-03-13 ENCOUNTER — Other Ambulatory Visit: Payer: Self-pay | Admitting: Gynecology

## 2011-03-13 ENCOUNTER — Encounter (INDEPENDENT_AMBULATORY_CARE_PROVIDER_SITE_OTHER): Payer: Medicare Other | Admitting: Gynecology

## 2011-03-13 DIAGNOSIS — R82998 Other abnormal findings in urine: Secondary | ICD-10-CM

## 2011-03-13 DIAGNOSIS — Z124 Encounter for screening for malignant neoplasm of cervix: Secondary | ICD-10-CM

## 2011-03-13 DIAGNOSIS — C549 Malignant neoplasm of corpus uteri, unspecified: Secondary | ICD-10-CM

## 2011-03-13 DIAGNOSIS — N952 Postmenopausal atrophic vaginitis: Secondary | ICD-10-CM

## 2011-03-13 DIAGNOSIS — M949 Disorder of cartilage, unspecified: Secondary | ICD-10-CM

## 2011-04-20 ENCOUNTER — Telehealth: Payer: Self-pay | Admitting: Pulmonary Disease

## 2011-04-20 NOTE — Telephone Encounter (Signed)
Pt advised of recs.Connor Meacham, CMA  

## 2011-04-20 NOTE — Telephone Encounter (Signed)
Per SN----we recs  Lozenges, cough drops, etc , keep a water bottle handy---since there is no other effective rx or advice for this.  thanks

## 2011-04-20 NOTE — Telephone Encounter (Signed)
Pt c/o a dry mouth x 3-4 months. She says she read that metoprolol could cause this and stopped taking x 1 month ago. She says the dryness did improve slightly and her BP has been fine. She wants to know if there is anything else she can try other than sipping on water throughout the day or keeping hard candy in her mouth all the time. She has had no new medications added to her regimen. Pls advise.

## 2011-04-21 ENCOUNTER — Encounter (INDEPENDENT_AMBULATORY_CARE_PROVIDER_SITE_OTHER): Payer: Medicare Other

## 2011-04-21 DIAGNOSIS — M949 Disorder of cartilage, unspecified: Secondary | ICD-10-CM

## 2011-05-25 ENCOUNTER — Ambulatory Visit (INDEPENDENT_AMBULATORY_CARE_PROVIDER_SITE_OTHER): Payer: Medicare Other | Admitting: Pulmonary Disease

## 2011-05-25 ENCOUNTER — Encounter: Payer: Self-pay | Admitting: Pulmonary Disease

## 2011-05-25 ENCOUNTER — Other Ambulatory Visit (INDEPENDENT_AMBULATORY_CARE_PROVIDER_SITE_OTHER): Payer: Medicare Other

## 2011-05-25 DIAGNOSIS — R300 Dysuria: Secondary | ICD-10-CM

## 2011-05-25 DIAGNOSIS — R5383 Other fatigue: Secondary | ICD-10-CM

## 2011-05-25 DIAGNOSIS — R5381 Other malaise: Secondary | ICD-10-CM

## 2011-05-25 DIAGNOSIS — I10 Essential (primary) hypertension: Secondary | ICD-10-CM

## 2011-05-25 DIAGNOSIS — E785 Hyperlipidemia, unspecified: Secondary | ICD-10-CM

## 2011-05-25 DIAGNOSIS — Z0000 Encounter for general adult medical examination without abnormal findings: Secondary | ICD-10-CM

## 2011-05-25 DIAGNOSIS — M949 Disorder of cartilage, unspecified: Secondary | ICD-10-CM

## 2011-05-25 LAB — BASIC METABOLIC PANEL
CO2: 30 mEq/L (ref 19–32)
Calcium: 9.8 mg/dL (ref 8.4–10.5)
Creatinine, Ser: 0.8 mg/dL (ref 0.4–1.2)
GFR: 77.24 mL/min (ref >60.00)
Glucose, Bld: 87 mg/dL (ref 70–99)
Sodium: 141 mEq/L (ref 135–145)

## 2011-05-25 LAB — TSH: TSH: 0.96 u[IU]/mL (ref 0.35–5.50)

## 2011-05-25 LAB — URINALYSIS, ROUTINE W REFLEX MICROSCOPIC
Bilirubin Urine: NEGATIVE
Specific Gravity, Urine: 1.01 (ref 1.000–1.030)
Urine Glucose: NEGATIVE
Urobilinogen, UA: 0.2 (ref 0.0–1.0)
pH: 6 (ref 5.0–8.0)

## 2011-05-25 LAB — CBC WITH DIFFERENTIAL/PLATELET
Basophils Absolute: 0 10*3/uL (ref 0.0–0.1)
Basophils Relative: 0.4 % (ref 0.0–3.0)
Eosinophils Absolute: 0.2 10*3/uL (ref 0.0–0.7)
Eosinophils Relative: 1.6 % (ref 0.0–5.0)
HCT: 45.7 % (ref 36.0–46.0)
Lymphs Abs: 2.9 10*3/uL (ref 0.7–4.0)
Monocytes Absolute: 0.7 10*3/uL (ref 0.1–1.0)
Neutro Abs: 6.8 10*3/uL (ref 1.4–7.7)
Platelets: 294 10*3/uL (ref 150.0–400.0)

## 2011-05-25 LAB — HEPATIC FUNCTION PANEL
ALT: 30 U/L (ref 0–35)
Albumin: 4.4 g/dL (ref 3.5–5.2)
Total Bilirubin: 0.5 mg/dL (ref 0.3–1.2)
Total Protein: 7.8 g/dL (ref 6.0–8.3)

## 2011-05-25 LAB — LIPID PANEL
Cholesterol: 164 mg/dL (ref 0–200)
Total CHOL/HDL Ratio: 4

## 2011-05-25 NOTE — Progress Notes (Signed)
Subjective:    Patient ID: Margaret Evans, female    DOB: [birth year], 65 y.o.   MRN: [id number]  HPI 65 y/o WF here for a follow up visit...   ~  May11:  she has been doing well and has no new complaints or concerns today... she notes some intermittent heel pain improved w/ inserts & stretching exercises...  ~  October 28, 2010:  Crescent callled in Dec w/ elev BP at home in the 150-160 range assoc w/ mild HA... we perscribed Losartan 50mg  but she developed urinary symptom w/ burning discomfort that she related to the new Rx so we switched her to METOPROLOL 50mg /d... pharm filled the tartrate which she's been taking once daily in AM & her nadir level BP's have all been good> 120-140/ 70 at home (we discussed this & OK to continue same & monitor BP in eve as well)...   she also had URI/ AB 12/11 & saw TP w/ ZPak, Mucinex, Hydromet Rx & resolved...  ~  May 25, 2011:  34mo ROV & she tells me that the Metoprolol caused a severe dry mouth & she had to stop it> now controlling BP w/ diet alone & BP checks have been good averaging 130s/ 70s at home;  Unfortunately she is really NOT dieting or exercising & we discussed this in detail> rec low carb, low fat, low sodium, wt reducing diet & exercise program eg- silver sneakers etc...    She remains on ASA daily; denies CP, palpit, dizzy, SOB, edema, etc; BP today = 122/76 & doing satis, perhaps some mild fatigue & she would like yearly labs checked fasting today;  She is tolerating Simva40;  DrFontaine, GYN does her BMDs thru his office & last was 6/12 by pt's report "the same, no change";  She is due for a 73yr f/u colon this yr & we will send reminder to [facility] [employer]...   Current Problems:   ALLERGIC RHINITIS (ICD-477.9) - stable, no recent problems, OTC Rx w/ Zyrtek/ Claritin Prn.  Hx of DIZZINESS (ICD-780.4) - no recent problems...  HYPERTENSION (ICD-401.9) - new dx 12/11 w/ home BP checks ~150-160 sys & assoc mild HA... she was tried on Losartan 50mg  w/  urinary burning symptoms that she thought was a side effect; changed to Metoprolol50 & improved but she developed severe dry mouth from this medication & had to stop it; now controlled on diet alone & her home BP checks ave 130/70- OK...  HYPERLIPIDEMIA (ICD-272.4) - on SIMVASTATIN 40mg /d now... tol well. ~  FLP 11/07 on Lipitor 20mg /d showed TChol 171, TG 116, HDL 45, LDL 103 ~  FLP 3/09 on Simva40 showed TChol 176, TG 172, HDL 37, LDL 104 ~  FLP 5/10 (wt=189#) on Simva40 showed TChol 181, TG 146, HDL 43, LDL 109 ~  FLP 5/11 (wt=189#) on Simva40 showed TChol 172, TG 159, HDL 42, LDL 98 ~  FLP 8/12 (wt=190#) on simva40 showed TChol 164, TG 152, HDL 47, LDL 87  Hx of HEPATITIS (ICD-573.3) - hx jaundice in 8th grade "alot of kids had it"... ? etiology (?HepA?), no known sequellae... all LFT's have been normal...  Hx of LUMBAGO (ICD-724.2) - seen 3/08 w/ LBP... rx'd w/ Robaxin, heat, Vicodin & improved... does exercises to keep in shape... no recent LBP complaints.  OSTEOPENIA (ICD-733.90) - followed by DrFontaine and [facility] ([facility])... we don't have a copy of her last BMD, but in 2006 her TScores were -1.2 to -2.0.Marland KitchenMarland Kitchen she was intol to Fosamax w/ joint pain  that resolved off this Rx... DrFontaine has opted for Ca++, Vits, and observation... we discussed Osteoporosis in detail and all the diff meds and implications for the future... she may wish to try Boniva & will discuss this w/ DrFontaine. ~  labs 3/09 showed Vit D level = 28... rec> start 1000 u OTC daily. ~  labs 5/10 showed Vit D level = 33... continue supplement. ~  1/12:  she reports 66 y/o sis had "fosamax fx" after being on it just 43yrs. ~  She gets f/u BMDs thru DrFontaine's office now...  DERM:  Hx of plantar wart, and poison ivy sensitivity...   Health Maintenance:   ~  GI:  she had routine colonoscopy 11/02 by DrPatterson and it was WNL... f/u planned 38yrs...  ~  GYN:  DrFontaine-  Hx cervical cancer w/ hysterectomy 1996;   +fam hx  breast cancer in sis, neg Mammograms yearly at [facility];  BMD followed by GYN/ Fontaine. ~  Vaccinations:  she gets yearly Flu vaccines;  Pneumovax given 02/27/09;  given TDAP 02/28/10 here;  had Shingles vacc 8/09 at [facility] w/ local reaction on arm...   Past Surgical History  Procedure Date  . Vesicovaginal fistula closure w/ tah   . Vaginal hysterectomy     [facility]SO    Outpatient Encounter Prescriptions as of 05/25/2011  Medication Sig Dispense Refill  . aspirin 81 MG tablet Take 81 mg by mouth daily.        . Calcium Carbonate-Vitamin D 600-400 MG-UNIT per tablet Take 1 tablet by mouth 2 (two) times daily.        . Cholecalciferol (VITAMIN D) 1000 UNITS capsule Take 1,000 Units by mouth daily.        . Cranberry 500 MG CAPS Take 1 capsule by mouth daily.        . Multiple Vitamin (MULTIVITAMIN) tablet Take 1 tablet by mouth daily.        . simvastatin (ZOCOR) 40 MG tablet Take 40 mg by mouth at bedtime.        Marland Kitchen DISCONTD: metoprolol (LOPRESSOR) 50 MG tablet Take 50 mg by mouth daily.          Allergies  Allergen Reactions  . Alendronate Sodium     REACTION: pt states INTOL w/ joint pains  . Fosamax   . Losartan Potassium     REACTION: frequent urination    Current Medications, Allergies, Past Medical History, Past Surgical History, Family History, and Social History were reviewed in [employer] record.    Review of Systems         See HPI - all other systems neg except as noted...  The patient denies anorexia, fever, weight loss, weight gain, vision loss, decreased hearing, hoarseness, chest pain, syncope, dyspnea on exertion, peripheral edema, prolonged cough, headaches, hemoptysis, abdominal pain, melena, hematochezia, severe indigestion/heartburn, hematuria, incontinence, muscle weakness, suspicious skin lesions, transient blindness, difficulty walking, depression, unusual weight change, abnormal bleeding, enlarged lymph nodes, and angioedema.      Objective:   Physical Exam     WD, Overweight, 65 y/o WF in NAD... GENERAL:  Alert & oriented; pleasant & cooperative. HEENT:  [place]/AT, EOM-wnl, PERRLA, EACs-clear, TMs-wnl, NOSE-clear, THROAT-clear & wnl. NECK:  Supple w/ full ROM; no JVD; normal carotid impulses w/o bruits; no thyromegaly or nodules palpated; no lymphadenopathy. CHEST:  Clear to P & A; without wheezes/ rales/ or rhonchi. HEART:  Regular Rhythm; without murmurs/ rubs/ or gallops. ABDOMEN:  Soft & nontender;  normal bowel sounds; no organomegaly or masses detected. EXT: without deformities or arthritic changes; no varicose veins/ +venous insuffic/ or edema. NEURO:  CN's intact; motor testing normal; sensory testing normal; gait normal & balance OK. DERM:  No lesions noted; no rash etc...  LABS:   FLP ok on Simva40 x TG 152> needs better low fat diet... CBC, Chems, TSH, UA> all clear, WNL.Marland KitchenMarland Kitchen   Assessment & Plan:   HBP>  Controlled on diet alone for now; advised low carb, low fat, wt reducing diet; avoid salt/ sodium as well...  HYPERLIPIDEMIA>  FLP looks ok on Simva40 just sl elev TG & needs better low fat diet...  Hx Hepatitis in 8th grade>  LFTs remain normal...  Hx LBP>  No recent problems, advised to increase exercise & get wt down (strengthen her "core")...  Osteopenia>  Followed & managed by DrFontaine... On Calcium, MVI, Vit D...  Health Maint>  She is up to date on vaccinations & will get the 2012 Flu vaccine when avail... Due for f/u colon 11/12 & we will send reminder to DrPatterson.Marland KitchenMarland Kitchen

## 2011-05-25 NOTE — Patient Instructions (Signed)
Today we updated your med list in EPIC...  Today we did your follow up fasting blood work...    Please call the PHONE TREE in a few days for your results...    Dial [id number] & when prompted enter your patient number followed by the # symbol...    Your patient number is:   [id number]#  Let's get on track w/ our Diet (low carb, low fat) & exercise program...    Remember to eliminate salt/ sodium from your diet...  Call for any problems...  Let's plan another routine follow up in 82yr, sooner if needed for problems.Marland KitchenMarland Kitchen

## 2011-08-31 ENCOUNTER — Telehealth: Payer: Self-pay | Admitting: Pulmonary Disease

## 2011-08-31 NOTE — Telephone Encounter (Signed)
I spoke with pt and is aware to call up to GI to have her apt scheduled. She verbalized understanding and was given # toc all. Pt needed nothing further

## 2011-09-24 ENCOUNTER — Telehealth: Payer: Self-pay | Admitting: Pulmonary Disease

## 2011-09-24 MED ORDER — AZITHROMYCIN 250 MG PO TABS: ORAL_TABLET | ORAL | Status: AC

## 2011-09-24 NOTE — Telephone Encounter (Signed)
Called and spoke with pt and she is aware to stay at home if possible---lots of things being spread around---rest, fluids, mucinex 2 po bid, tylenol regulary, zpak #1  Take a directed with no refills.  Pt is aware of SN recs and will call back for any concerns.

## 2011-09-24 NOTE — Telephone Encounter (Signed)
I spoke with pt and she c/o sore throat, chest congestion, nasal congestion, PND, cough w/ yellow phlem, face feels tight, unknown if she has a fever x 2 days. Pt states she has tried taking mucinex, nasal spray and sudafed. Pt requested an apt to be seen but no available openings. Please advise recs for pt Dr. Kriste Basque, thanks  Allergies  Allergen Reactions  . Alendronate Sodium     REACTION: pt states INTOL w/ joint pains  . Fosamax   . Losartan Potassium     REACTION: frequent urination

## 2011-10-05 ENCOUNTER — Ambulatory Visit (AMBULATORY_SURGERY_CENTER): Payer: Medicare Other | Admitting: *Deleted

## 2011-10-05 ENCOUNTER — Encounter: Payer: Self-pay | Admitting: Gastroenterology

## 2011-10-05 VITALS — Ht 62.0 in | Wt 188.3 lb

## 2011-10-05 DIAGNOSIS — Z1211 Encounter for screening for malignant neoplasm of colon: Secondary | ICD-10-CM

## 2011-10-05 MED ORDER — PEG-KCL-NACL-NASULF-NA ASC-C 100 G PO SOLR: ORAL | Status: DC

## 2011-10-14 ENCOUNTER — Ambulatory Visit (AMBULATORY_SURGERY_CENTER): Payer: Medicare Other | Admitting: Gastroenterology

## 2011-10-14 ENCOUNTER — Encounter: Payer: Self-pay | Admitting: Gastroenterology

## 2011-10-14 VITALS — BP 121/64 | HR 63 | Temp 97.2°F | Resp 12 | Ht 62.0 in | Wt 188.0 lb

## 2011-10-14 DIAGNOSIS — Z1211 Encounter for screening for malignant neoplasm of colon: Secondary | ICD-10-CM | POA: Insufficient documentation

## 2011-10-14 DIAGNOSIS — D126 Benign neoplasm of colon, unspecified: Secondary | ICD-10-CM

## 2011-10-14 DIAGNOSIS — K5730 Diverticulosis of large intestine without perforation or abscess without bleeding: Secondary | ICD-10-CM | POA: Insufficient documentation

## 2011-10-14 MED ORDER — SODIUM CHLORIDE 0.9 % IV SOLN: 500.0000 mL | INTRAVENOUS | Status: DC

## 2011-10-14 NOTE — Progress Notes (Signed)
I assisted the pt to the restroom.  No problems.  MAw  No complaints noted in the recovery room. Maw  Patient did not experience any of the following events: a burn prior to discharge; a fall within the facility; wrong site/side/patient/procedure/implant event; or a hospital transfer or hospital admission upon discharge from the facility. [phone]) Patient did not have preoperative order for IV antibiotic SSI prophylaxis. [phone])

## 2011-10-14 NOTE — Op Note (Signed)
[facility] [address] [place], [place]  [address]  COLONOSCOPY PROCEDURE REPORT  PATIENT:  Margaret Evans, Margaret Evans  MR#:  [id number] BIRTHDATE:  12-Sep-1946, 66 yrs. old  GENDER:  female ENDOSCOPIST:  Vania Rea. Jarold Motto, MD, [facility] REF. BY: PROCEDURE DATE:  10/14/2011 PROCEDURE:  Colonoscopy with snare polypectomy ASA CLASS:  Class II INDICATIONS:  Routine Risk Screening MEDICATIONS:   propofol (Diprivan) 300 mg IV  DESCRIPTION OF PROCEDURE:   After the risks and benefits and of the procedure were explained, informed consent was obtained. Digital rectal exam was performed and revealed no abnormalities. The LB 180AL [id number] endoscope was introduced through the anus and advanced to the cecum, which was identified by both the appendix and ileocecal valve.  The quality of the prep was excellent, using MoviPrep.  The instrument was then slowly withdrawn as the colon was fully examined. <<PROCEDUREIMAGES>>  FINDINGS:  ULTRASONIC FINDINGS:   A sessile polyp was found in the cecum. .5 CM FLAT CECAL POLYP PIECEMEAL REMOVED FROM CECUM.SEE PICTURES. There were mild diverticular changes in left colon. diverticulosis was found.  This was otherwise a normal examination of the colon.   Retroflexed views in the rectum revealed no abnormalities.    The scope was then withdrawn from the patient and the procedure completed.  COMPLICATIONS:  None ENDOSCOPIC IMPRESSION: 1) Diverticulosis,mild,left sided diverticulosis 2) Sessile polyp in the cecum 3) Otherwise normal examination RECOMMENDATIONS: 1) Repeat Colonoscopy in 3 years.  REPEAT EXAM:  No  ______________________________ Vania Rea. Jarold Motto, MD, [facility]  CC:  n. eSIGNED:   Vania Rea. Margaret Evans at 10/14/2011 11:51 AM  Newell Coral, [id number]

## 2011-10-14 NOTE — Patient Instructions (Signed)
FOLLOW INSTRUCTIONS ON THE GREEN AND BLUE INSTRUCTION SHEETS.  CONTINUE YOUR MEDICATIONS.  HIGH FIBER DIET.  AWAIT PATHOLOGY RESULTS.

## 2011-10-15 ENCOUNTER — Telehealth: Payer: Self-pay | Admitting: *Deleted

## 2011-10-15 NOTE — Telephone Encounter (Signed)

## 2011-10-20 ENCOUNTER — Encounter: Payer: Self-pay | Admitting: Gastroenterology

## 2012-01-08 ENCOUNTER — Encounter: Payer: Self-pay | Admitting: Gynecology

## 2012-01-12 ENCOUNTER — Other Ambulatory Visit: Payer: Self-pay | Admitting: *Deleted

## 2012-01-12 ENCOUNTER — Other Ambulatory Visit: Payer: Self-pay | Admitting: Gynecology

## 2012-01-12 DIAGNOSIS — N63 Unspecified lump in unspecified breast: Secondary | ICD-10-CM

## 2012-01-18 ENCOUNTER — Other Ambulatory Visit: Payer: Self-pay

## 2012-01-21 ENCOUNTER — Other Ambulatory Visit: Payer: Self-pay | Admitting: *Deleted

## 2012-01-21 ENCOUNTER — Other Ambulatory Visit: Payer: Self-pay | Admitting: Gynecology

## 2012-01-21 DIAGNOSIS — N63 Unspecified lump in unspecified breast: Secondary | ICD-10-CM

## 2012-01-25 ENCOUNTER — Encounter: Payer: Self-pay | Admitting: Gynecology

## 2012-03-22 ENCOUNTER — Ambulatory Visit (INDEPENDENT_AMBULATORY_CARE_PROVIDER_SITE_OTHER): Payer: Medicare Other | Admitting: Gynecology

## 2012-03-22 ENCOUNTER — Other Ambulatory Visit (HOSPITAL_COMMUNITY)
Admission: RE | Admit: 2012-03-22 | Discharge: 2012-03-22 | Disposition: A | Discharge status: Home / Self Care | Payer: Medicare Other | Source: Ambulatory Visit | Attending: Gynecology | Admitting: Gynecology

## 2012-03-22 ENCOUNTER — Encounter: Payer: Self-pay | Admitting: Gynecology

## 2012-03-22 VITALS — BP 126/78 | Ht 61.0 in | Wt 190.0 lb

## 2012-03-22 DIAGNOSIS — C549 Malignant neoplasm of corpus uteri, unspecified: Secondary | ICD-10-CM

## 2012-03-22 DIAGNOSIS — C541 Malignant neoplasm of endometrium: Secondary | ICD-10-CM

## 2012-03-22 DIAGNOSIS — Z01419 Encounter for gynecological examination (general) (routine) without abnormal findings: Secondary | ICD-10-CM | POA: Insufficient documentation

## 2012-03-22 DIAGNOSIS — M949 Disorder of cartilage, unspecified: Secondary | ICD-10-CM

## 2012-03-22 DIAGNOSIS — N952 Postmenopausal atrophic vaginitis: Secondary | ICD-10-CM

## 2012-03-22 DIAGNOSIS — Z1272 Encounter for screening for malignant neoplasm of vagina: Secondary | ICD-10-CM

## 2012-03-22 DIAGNOSIS — M8580 Other specified disorders of bone density and structure, unspecified site: Secondary | ICD-10-CM

## 2012-03-22 DIAGNOSIS — Z124 Encounter for screening for malignant neoplasm of cervix: Secondary | ICD-10-CM

## 2012-03-22 NOTE — Progress Notes (Signed)
Margaret Evans 1946-08-08 [id number]        66 y.o.  for annual exam.  Doing well. Several issues noted below.  Past medical history,surgical history, medications, allergies, family history and social history were all reviewed and documented in the EPIC chart. ROS:  Was performed and pertinent positives and negatives are included in the history.  Exam: Margaret Evans chaperone present Filed Vitals:   03/22/12 0938  BP: 126/78   General appearance  Normal Skin grossly normal Head/Neck normal with no cervical or supraclavicular adenopathy thyroid normal Lungs  clear Cardiac RR, without RMG Abdominal  soft, nontender, without masses, organomegaly or hernia Breasts  examined lying and sitting without masses, retractions, discharge or axillary adenopathy. Pelvic  Ext/BUS/vagina  normal with atrophic genital changes. Pap of cuff done  Adnexa  Without masses or tenderness    Anus and perineum  normal   Rectovaginal  normal sphincter tone without palpated masses or tenderness.    Assessment/Plan:  66 y.o. female for annual exam.    1. 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 Exam today is normal with NED.  Pap of cuff done. 2. Mammography. Patient had mammogram in April. She'll continue mammography. SBE monthly reviewed. 3. Colonoscopy. Patient had a colonoscopy in January. They did find a polyp. She'll follow up with them at suggested repeat interval. 4. Osteopenia. DEXA a year ago 04/2011 showed T score -2.3. This was stable from her prior study 2010. Will repeat next year. Increase calcium and vitamin D. She had tried Fosamax transiently but had a lot of GI upset years ago. She does note that her sister apparently had 2 atypical fractures on Fosamax. 5. Health maintenance. No lab work was done today this is all done through Dr. Jodelle Green office who sees her on a regular basis. Assuming she continues well from a gynecologic standpoint and she will see me  in a year, sooner as needed.  Margaret Lords MD, 10:18 AM 03/22/2012

## 2012-03-22 NOTE — Patient Instructions (Signed)
Follow up in one year for annual gynecologic exam. 

## 2012-05-10 IMAGING — CR DG CHEST 2V
2 series · 2 of 2 positions shown · non-contrast
Comparison: 02/27/2009

CLINICAL DATA: Physical exam.

CHEST - 2 VIEW

[view not recorded (1 of 2)]
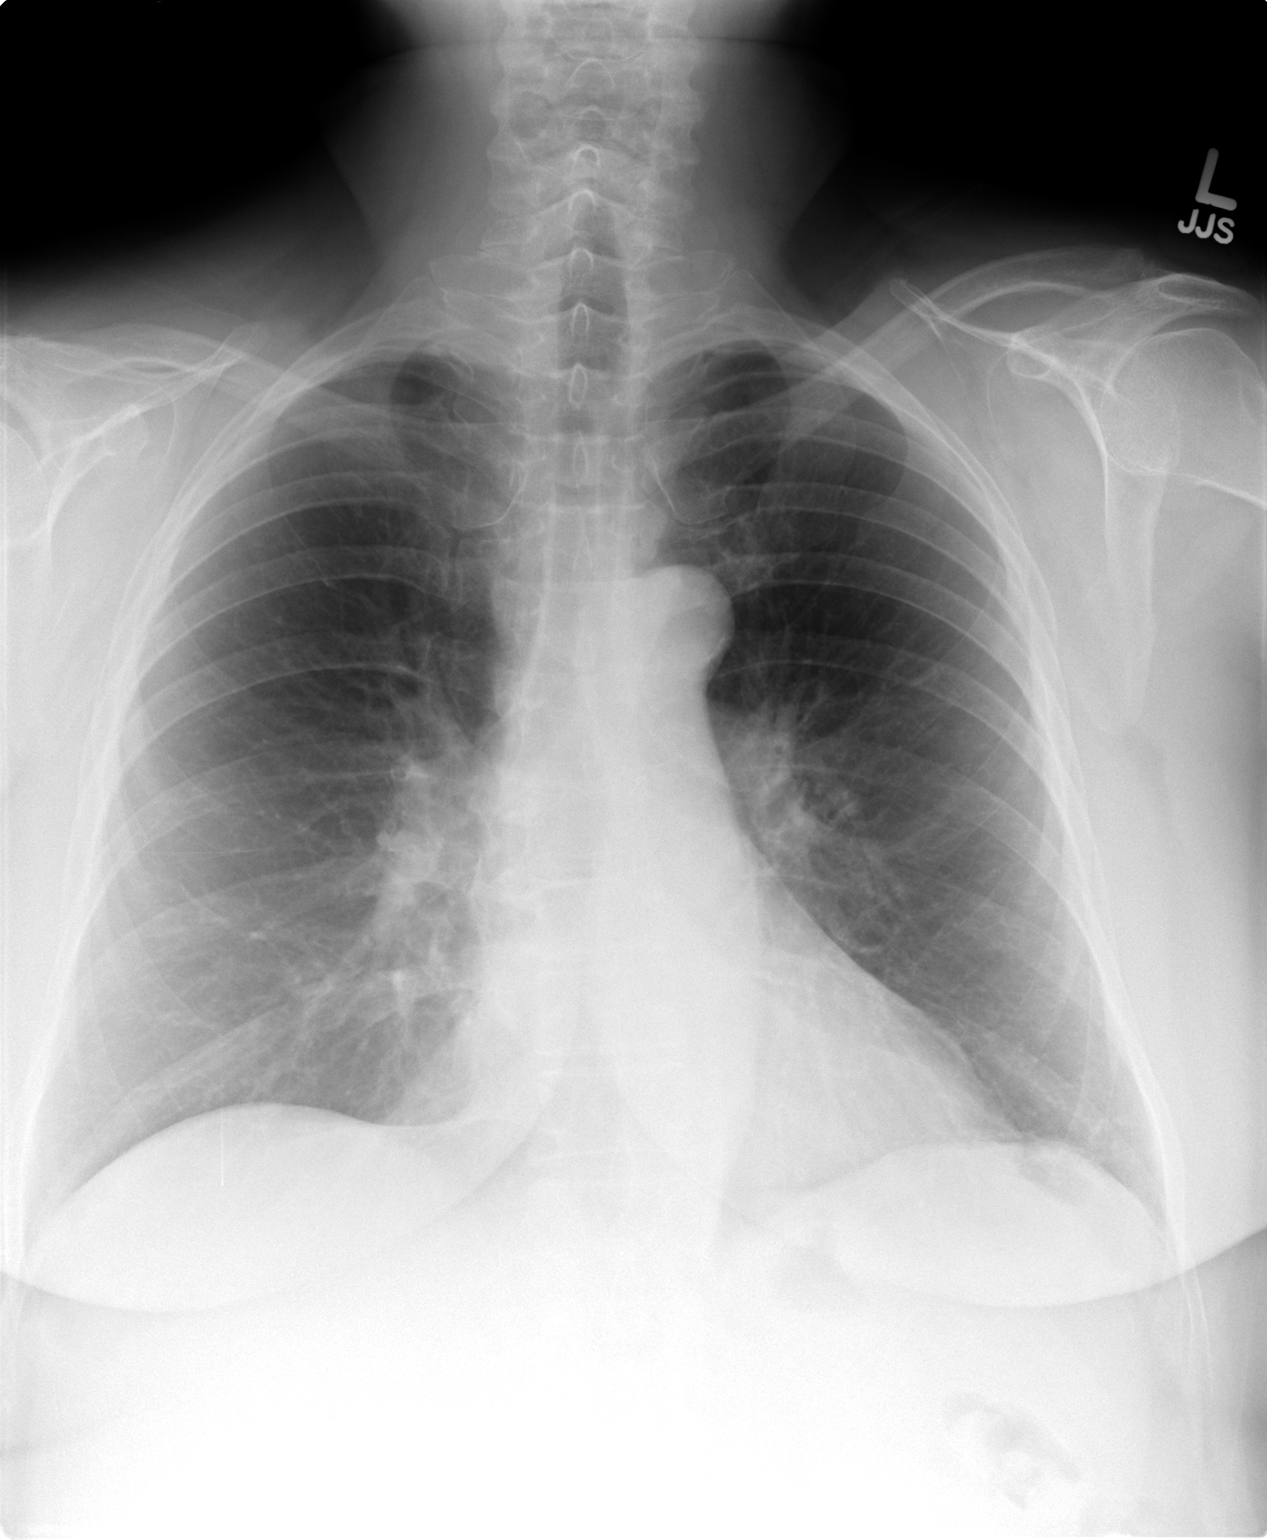

[view not recorded (2 of 2)]
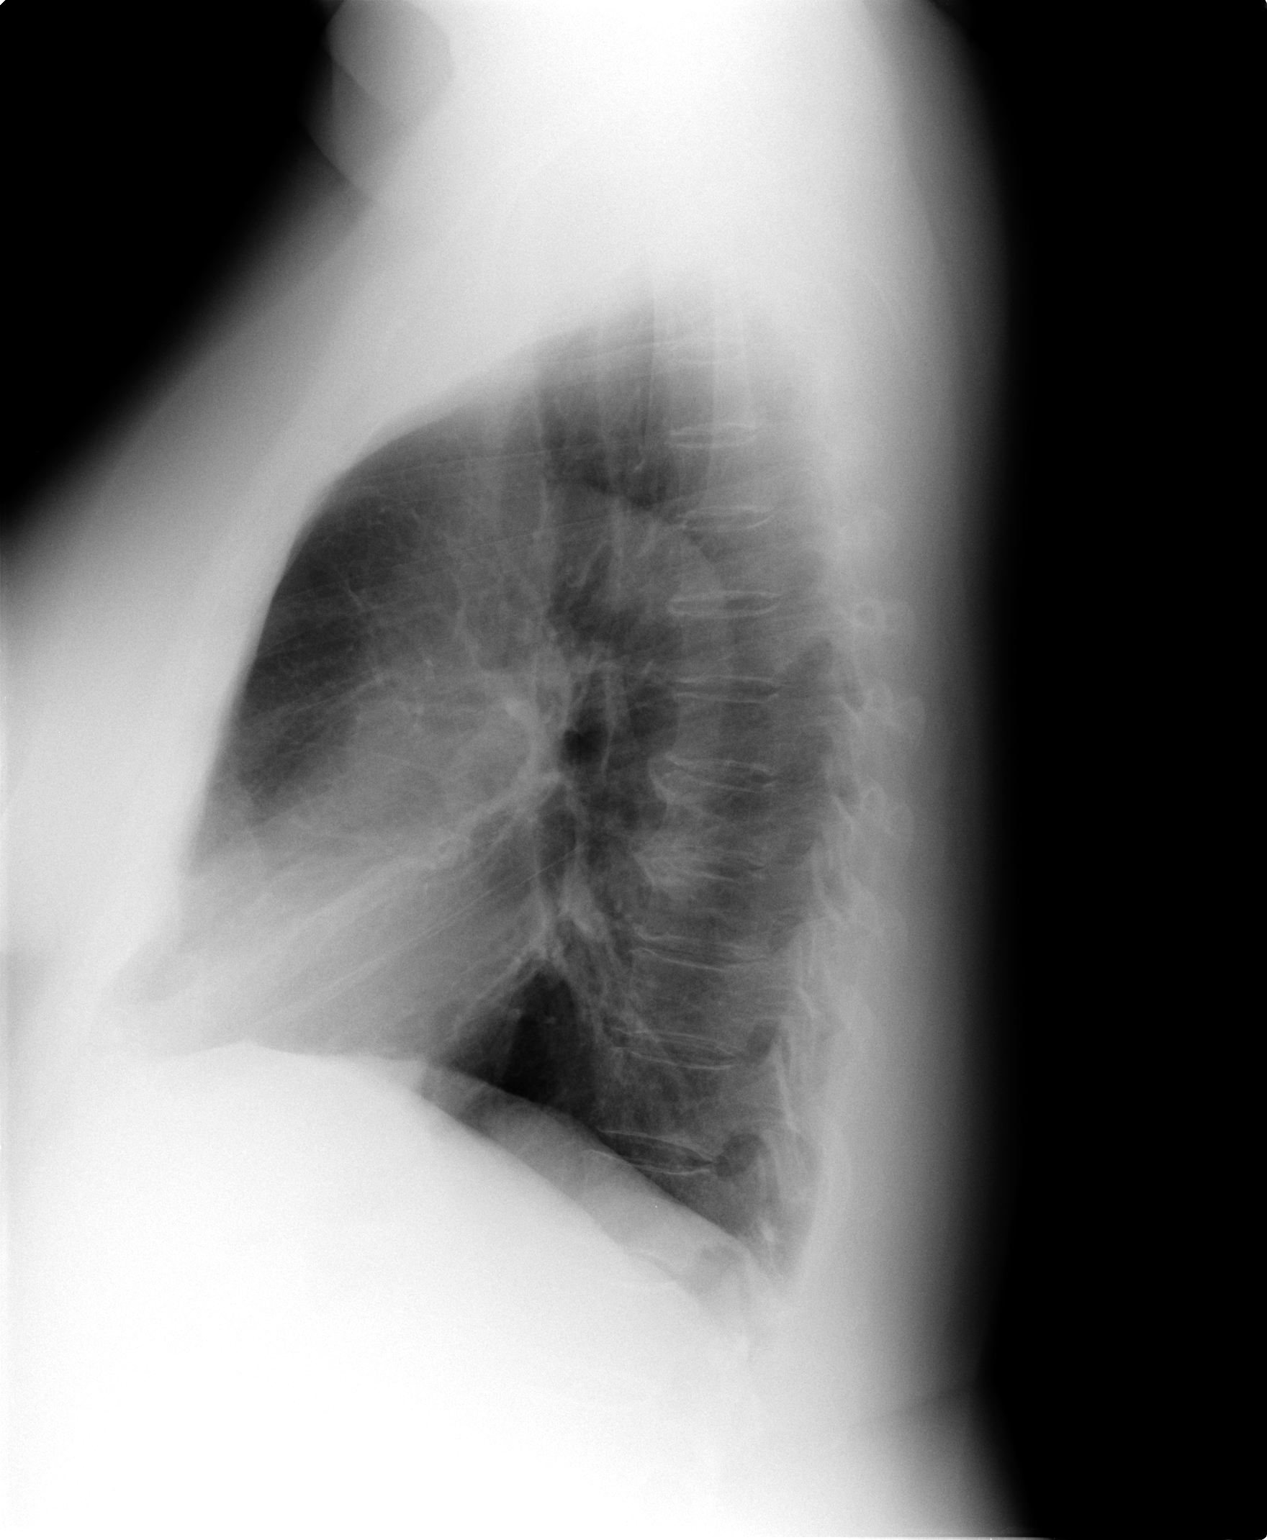

[2 of 2 positions shown; findings below may reference images not displayed]

FINDINGS: Heart size upper normal.  Vascularity normal.  There is
no infiltrate, effusion, or mass lesion.  There is no heart
failure.  No significant interval change.
IMPRESSION: No acute cardiopulmonary disease.

## 2012-05-23 ENCOUNTER — Other Ambulatory Visit (INDEPENDENT_AMBULATORY_CARE_PROVIDER_SITE_OTHER): Payer: Medicare Other

## 2012-05-23 ENCOUNTER — Encounter: Payer: Self-pay | Admitting: Pulmonary Disease

## 2012-05-23 ENCOUNTER — Ambulatory Visit (INDEPENDENT_AMBULATORY_CARE_PROVIDER_SITE_OTHER): Payer: Medicare Other | Admitting: Pulmonary Disease

## 2012-05-23 VITALS — BP 152/84 | HR 53 | Temp 97.2°F | Ht 63.0 in | Wt 190.0 lb

## 2012-05-23 DIAGNOSIS — E559 Vitamin D deficiency, unspecified: Secondary | ICD-10-CM

## 2012-05-23 DIAGNOSIS — E785 Hyperlipidemia, unspecified: Secondary | ICD-10-CM

## 2012-05-23 DIAGNOSIS — F411 Generalized anxiety disorder: Secondary | ICD-10-CM

## 2012-05-23 DIAGNOSIS — I10 Essential (primary) hypertension: Secondary | ICD-10-CM

## 2012-05-23 DIAGNOSIS — K5730 Diverticulosis of large intestine without perforation or abscess without bleeding: Secondary | ICD-10-CM

## 2012-05-23 DIAGNOSIS — F419 Anxiety disorder, unspecified: Secondary | ICD-10-CM

## 2012-05-23 DIAGNOSIS — D126 Benign neoplasm of colon, unspecified: Secondary | ICD-10-CM

## 2012-05-23 LAB — BASIC METABOLIC PANEL
CO2: 29 mEq/L (ref 19–32)
Calcium: 9.9 mg/dL (ref 8.4–10.5)
Chloride: 105 mEq/L (ref 96–112)
Glucose, Bld: 83 mg/dL (ref 70–99)
Potassium: 4.1 mEq/L (ref 3.5–5.1)
Sodium: 141 mEq/L (ref 135–145)

## 2012-05-23 LAB — CBC WITH DIFFERENTIAL/PLATELET
Basophils Absolute: 0 10*3/uL (ref 0.0–0.1)
Eosinophils Absolute: 0.1 10*3/uL (ref 0.0–0.7)
Hemoglobin: 14.6 g/dL (ref 12.0–15.0)
Lymphocytes Relative: 25.2 % (ref 12.0–46.0)
Lymphs Abs: 2.3 10*3/uL (ref 0.7–4.0)
MCHC: 34.1 g/dL (ref 30.0–36.0)
Neutro Abs: 6 10*3/uL (ref 1.4–7.7)
Platelets: 263 10*3/uL (ref 150.0–400.0)
RDW: 13.3 % (ref 11.5–14.6)

## 2012-05-23 LAB — TSH: TSH: 1.07 u[IU]/mL (ref 0.35–5.50)

## 2012-05-23 LAB — HEPATIC FUNCTION PANEL
AST: 22 U/L (ref 0–37)
Albumin: 4.2 g/dL (ref 3.5–5.2)
Alkaline Phosphatase: 87 U/L (ref 39–117)
Bilirubin, Direct: 0.1 mg/dL (ref 0.0–0.3)
Total Protein: 7.4 g/dL (ref 6.0–8.3)

## 2012-05-23 LAB — LIPID PANEL: Total CHOL/HDL Ratio: 3

## 2012-05-23 MED ORDER — SIMVASTATIN 40 MG PO TABS: 40.0000 mg | ORAL_TABLET | Freq: Every day | ORAL | Status: DC

## 2012-05-23 NOTE — Patient Instructions (Addendum)
Today we updated your med list in our EPIC system...    Continue your current medications the same...    We refilled your Rx per request...  Today we did your follow up EKG & FASTING blood work...    We will call you w/ the results...  We will sched a baseline 2DEchocardiogram to check your heart muscle and valves...    Then we can better assess whether to recommend medication for your BP or not...  Please let me know at any time if you feel you need an anti-anxiety medication...  Let's continue our yearly follow up visits, but call anytime if needed for problems.Marland KitchenMarland Kitchen

## 2012-05-23 NOTE — Progress Notes (Addendum)
Subjective:    Patient ID: Margaret Evans, female    DOB: 1946/03/01, 66 y.o.   MRN: [id number]  HPI 66 y/o WF here for a follow up visit...   ~  May11:  she has been doing well and has no new complaints or concerns today... she notes some intermittent heel pain improved w/ inserts & stretching exercises...  ~  October 28, 2010:  Venna callled in Dec w/ elev BP at home in the 150-160 range assoc w/ mild HA... we perscribed Losartan 50mg  but she developed urinary symptom w/ burning discomfort that she related to the new Rx so we switched her to METOPROLOL 50mg /d... pharm filled the tartrate which she's been taking once daily in AM & her nadir level BP's have all been good> 120-140/ 70 at home (we discussed this & OK to continue same & monitor BP in eve as well)...   she also had URI/ AB 12/11 & saw TP w/ ZPak, Mucinex, Hydromet Rx & resolved...  ~  May 25, 2011:  7mo ROV & she tells me that the Metoprolol caused a severe dry mouth & she had to stop it> now controlling BP w/ diet alone & BP checks have been good averaging 130s/ 70s at home;  Unfortunately she is really NOT dieting or exercising & we discussed this in detail> rec low carb, low fat, low sodium, wt reducing diet & exercise program eg- silver sneakers etc...    She remains on ASA daily; denies CP, palpit, dizzy, SOB, edema, etc; BP today = 122/76 & doing satis, perhaps some mild fatigue & she would like yearly labs checked fasting today;  She is tolerating Simva40;  DrFontaine, GYN does her BMDs thru his office & last was 6/12 by pt's report "the same, no change";  She is due for a 79yr f/u colon this yr & we will send reminder to [employer]...  ~  May 23, 2012:  Yearly ROV & Kynnedy is tearful w/ mother recently dx w/ brain tumor (Glioblastoma) & being [place] in [place]; I offered pt an anxiolytic med but she declines at this time & we left the door open should she need med later on...    AR>  Stable on OTC antihist etc...    HBP>   Prev on meds but stopped due to perceived side effects; BP= 152/84 & she denies CP, palpit, SOB, edema; we decided to check 2DEcho before deciding on med rx ==> pending    Abn EKG>  On ASA81mg /d; EKG shows SBrady, rate52, poor R prog V1-3 & NSSTTWA; 2DEcho is pending...    Hyperlipid>  On Simva40; FLP showed TChol     GI> Divertics, polyps>  She had f/u colonoscopy 1/13 by DRP w/ divertics & one sessile polyp in cecum; Bx= tubular adenoma & f/u planned 34yrs.    Hx LBP & Osteopenia>  No further back pain, but BMD 7/12 by DrFontaine showed TScore -2.3 in right [facility]; she's been intol to Fosamax & he rec incr in Calc & VitD, plus weight bearing exercise; Vit D level =     We reviewed prob list, meds, xrays and labs> see below>> EKG 8/13 shows SBrady, rate52, poor R prog V1-3 & NSSTTWA... LABS 8/13:  FLP- at goals on Simva40;  Chems- wnl;  CBC- wnl;  TSH=1.07;  VitD=52 on 1000u supplement. 2DEcho ==> Done 05/30/12:  Normal LV size & function w/ EF=65-70%, normal wall thickness, Gr1DD, normal valves, etc... Therefore doesn't need BP meds now but needs  better diet- no salt- keep wt down- monitor BP at home & call if BP regularly 150/90 or higher to initiate med rx!    Problem List:     ALLERGIC RHINITIS (ICD-477.9) - stable, no recent problems, OTC Rx w/ Zyrtek/ Claritin Prn.  Hx of DIZZINESS (ICD-780.4) - no recent problems...  HYPERTENSION (ICD-401.9) - new dx 12/11 w/ home BP checks ~150-160 sys & assoc mild HA... she was tried on Losartan 50mg  w/ urinary burning symptoms that she thought was a side effect; changed to Metoprolol50 & improved but she developed severe dry mouth from this medication & had to stop it; now controlled on diet alone & her home BP checks ave 130/70- OK... ~  CXR 5/11 showed heart at upper lim of norm, clear lungs, NAD.Marland Kitchen. ~  5/12:  BP= 122/76 off meds & she denies CP, palpit, SOB, edema, etc... ~  8/13:  BP= 152/84 off meds & we will check 2DEcho to help US guide  antihypertensive therapy... ~  2DEcho 8/13 showed normal LV size & function w/ EF=65-70%, normal wall thickness, Gr1DD, normal valves, etc... Therefore doesn't need BP meds now but needs better diet- no salt- keep wt down- monitor BP at home & call if BP regularly 150/90 or higher to initiate med rx!  ABNORMAL EKG >> ~  EKG 8/13 showed SBrady, rate52, poor R prog V1-3 & NSSTTWA... ~  2DEcho 8/13 ==> see above  HYPERLIPIDEMIA (ICD-272.4) - on SIMVASTATIN 40mg /d now... tol well. ~  FLP 11/07 on Lipitor 20mg /d showed TChol 171, TG 116, HDL 45, LDL 103 ~  FLP 3/09 on Simva40 showed TChol 176, TG 172, HDL 37, LDL 104 ~  FLP 5/10 (wt=189#) on Simva40 showed TChol 181, TG 146, HDL 43, LDL 109 ~  FLP 5/11 (wt=189#) on Simva40 showed TChol 172, TG 159, HDL 42, LDL 98 ~  FLP 8/12 (wt=190#) on simva40 showed TChol 164, TG 152, HDL 47, LDL 87 ~  FLP 8/13 (wt=190#) on Simva40 showed TChol 149, TG 146, HDL 46, LDL 73  DIVERTICULOSIS >> COLON POLYP >> ~  She had baseline Colonoscopy by DrPatterson 2002- neg, and f/u planned 27yrs... ~  She had routine f/u colonoscopy 1/13 by DrPatterson w/ mild divertics and one sessile polyp in cecum; Bx= tubular adenoma & f/u planned 48yrs per DRP.  Hx of HEPATITIS (ICD-573.3) - hx jaundice in 8th grade "alot of kids had it"... ? etiology (?HepA?), no known sequellae... all LFT's have been normal...  Hx of LUMBAGO (ICD-724.2) - seen 3/08 w/ LBP... rx'd w/ Robaxin, heat, Vicodin & improved... does exercises to keep in shape... no recent LBP complaints.  OSTEOPENIA (ICD-733.90) - followed by DrFontaine and SER ([facility])... we don't have a copy of her last BMD, but in 2006 her TScores were -1.2 to -2.0.Marland KitchenMarland Kitchen she was intol to Fosamax w/ joint pain that resolved off this Rx... DrFontaine has opted for Ca++, Vits, and observation... we discussed Osteoporosis in detail and all the diff meds and implications for the future... she may wish to try Boniva & will discuss this w/  DrFontaine. ~  labs 3/09 showed Vit D level = 28... rec> start 1000 u OTC daily. ~  labs 5/10 showed Vit D level = 33... continue supplement. ~  1/12:  she reports 67 y/o sis had "fosamax fx" after being on it just 31yrs. ~  She gets f/u BMDs thru DrFontaine's office> done 7/12 w/ lowest TScore -2.3 in right [facility]; He increased her Calcium & VitD noting  INTOL to Fosamax in past & sister had atyp fractures from Fosamax Rx... They plan repeat 7/14. ~  Labs 8/13 showed Vit D level = 52... Rec> continue 1000u supplement daily.  DERM:  Hx of plantar wart, and poison ivy sensitivity...   Health Maintenance:   ~  GI:  Followed by DrPatterson and she is up to date on colonoscopies (see above). ~  GYN:  DrFontaine-  Hx cervical cancer w/ hysterectomy 1996; She has regular f/u w/ DrFontaine w/ PAP etc;  +fam hx breast cancer in sis, neg M[facility]ograms [facility];  BMD followed by GYN/ Fontaine. ~  Vaccinations:  she gets yearly Flu vaccines;  Pneumovax given 02/27/09;  given TDAP 02/28/10 here;  had Shingles vacc 8/09 at [facility] w/ local reaction on arm...   Past Surgical History  Procedure Date  . Vaginal hysterectomy 1996    LAVH BSO    Outpatient Encounter Prescriptions as of 05/23/2012  Medication Sig Dispense Refill  . aspirin 81 MG tablet Take 81 mg by mouth daily.       . Calcium Carbonate-Vitamin D 600-400 MG-UNIT per tablet Take 1 tablet by mouth 2 (two) times daily.        . Cholecalciferol (VITAMIN D) 1000 UNITS capsule Take 1,000 Units by mouth daily.        . Multiple Vitamin (MULTIVITAMIN) tablet Take 1 tablet by mouth daily.        . simvastatin (ZOCOR) 40 MG tablet Take 40 mg by mouth at bedtime.          Allergies  Allergen Reactions  . Alendronate Sodium     REACTION: pt states INTOL w/ joint pains  . Losartan Potassium     REACTION: frequent urination    Current Medications, Allergies, Past Medical History, Past Surgical History, Family History, and Social History were  reviewed in [employer] record.    Review of Systems         See HPI - all other systems neg except as noted...  The patient denies anorexia, fever, weight loss, weight gain, vision loss, decreased hearing, hoarseness, chest pain, syncope, dyspnea on exertion, peripheral edema, prolonged cough, headaches, hemoptysis, abdominal pain, melena, hematochezia, severe indigestion/heartburn, hematuria, incontinence, muscle weakness, suspicious skin lesions, transient blindness, difficulty walking, depression, unusual weight change, abnormal bleeding, enlarged lymph nodes, and angioedema.     Objective:   Physical Exam     WD, Overweight, 66 y/o WF in NAD... GENERAL:  Alert & oriented; pleasant & cooperative. HEENT:  [place]/AT, EOM-wnl, PERRLA, EACs-clear, TMs-wnl, NOSE-clear, THROAT-clear & wnl. NECK:  Supple w/ full ROM; no JVD; normal carotid impulses w/o bruits; no thyromegaly or nodules palpated; no lymphadenopathy. CHEST:  Clear to P & A; without wheezes/ rales/ or rhonchi. HEART:  Regular Rhythm; without murmurs/ rubs/ or gallops. ABDOMEN:  Soft & nontender; normal bowel sounds; no organomegaly or masses detected. EXT: without deformities or arthritic changes; no varicose veins/ +venous insuffic/ or edema. NEURO:  CN's intact; motor testing normal; sensory testing normal; gait normal & balance OK. DERM:  No lesions noted; no rash etc...  RADIOLOGY DATA:  Reviewed in the EPIC EMR & discussed w/ the patient...  LABORATORY DATA:  Reviewed in the EPIC EMR & discussed w/ the patient...   Assessment & Plan:   HBP>  Controlled on diet alone for now; advised low carb, low fat, wt reducing diet; avoid salt/ sodium as well... 2DEcho did NOT show any LVH etc & she doesn't  want meds but will monitor BP at home & call if >150/90...  HYPERLIPIDEMIA>  FLP looks ok on Simva40 & needs better low fat diet...  Hx Hepatitis in 8th grade>  LFTs remain normal...  Hx LBP>  No recent  problems, advised to increase exercise & get wt down (strengthen her "core")...  Osteopenia>  Followed & managed by DrFontaine... On Calcium, MVI, Vit D...  Health Maint>  She is up to date on vaccinations & will get the 2013 Flu vaccine when avail...   Patient's Medications  New Prescriptions   No medications on file  Previous Medications   ASPIRIN 81 MG TABLET    Take 81 mg by mouth daily.    CALCIUM CARBONATE-VITAMIN D 600-400 MG-UNIT PER TABLET    Take 1 tablet by mouth 2 (two) times daily.     CHOLECALCIFEROL (VITAMIN D) 1000 UNITS CAPSULE    Take 1,000 Units by mouth daily.     MULTIPLE VITAMIN (MULTIVITAMIN) TABLET    Take 1 tablet by mouth daily.    Modified Medications   Modified Medication Previous Medication   SIMVASTATIN (ZOCOR) 40 MG TABLET simvastatin (ZOCOR) 40 MG tablet      Take 1 tablet (40 mg total) by mouth at bedtime.    Take 40 mg by mouth at bedtime.    Discontinued Medications   No medications on file

## 2012-05-24 LAB — VITAMIN D 25 HYDROXY (VIT D DEFICIENCY, FRACTURES): Vit D, 25-Hydroxy: 52 ng/mL (ref 30–89)

## 2012-05-30 ENCOUNTER — Ambulatory Visit (HOSPITAL_COMMUNITY): Payer: Medicare Other | Attending: Internal Medicine

## 2012-05-30 DIAGNOSIS — I079 Rheumatic tricuspid valve disease, unspecified: Secondary | ICD-10-CM | POA: Insufficient documentation

## 2012-05-30 DIAGNOSIS — I10 Essential (primary) hypertension: Secondary | ICD-10-CM

## 2012-05-30 DIAGNOSIS — I059 Rheumatic mitral valve disease, unspecified: Secondary | ICD-10-CM | POA: Insufficient documentation

## 2012-05-30 DIAGNOSIS — R9431 Abnormal electrocardiogram [ECG] [EKG]: Secondary | ICD-10-CM

## 2012-05-30 DIAGNOSIS — E785 Hyperlipidemia, unspecified: Secondary | ICD-10-CM | POA: Insufficient documentation

## 2012-05-30 NOTE — Progress Notes (Signed)
Echocardiogram performed.  

## 2012-08-18 ENCOUNTER — Other Ambulatory Visit: Payer: Self-pay | Admitting: Gynecology

## 2012-08-18 DIAGNOSIS — N63 Unspecified lump in unspecified breast: Secondary | ICD-10-CM

## 2012-09-13 ENCOUNTER — Telehealth: Payer: Self-pay | Admitting: Pulmonary Disease

## 2012-09-13 MED ORDER — MECLIZINE HCL 25 MG PO TABS: ORAL_TABLET | ORAL | Status: DC

## 2012-09-13 NOTE — Telephone Encounter (Signed)
Pt aware of rx for Medlizine and will call if this does not help or seek emergency help if worse.

## 2012-09-13 NOTE — Telephone Encounter (Signed)
Per SN---ok to call in meclizine 25 mg  Take 1/2 to 1 tablet by mouth every 4 hours as needed for dizziness.  Refill x 2    thanks

## 2012-09-13 NOTE — Telephone Encounter (Signed)
Spoke with pt. She states for the past 2 days has been feeling dizzy- occurs upon standing and when she turns her head suddenly. She has been taking dramamine otc with not much relief. She states that she is not having any ear pressure, or head congestion. Would like to know what SN can recommend. Please advise, thanks! Last ov 05/23/12 Next ov 05/23/13  Allergies  Allergen Reactions  . Alendronate Sodium     REACTION: pt states INTOL w/ joint pains  . Losartan Potassium     REACTION: frequent urination

## 2013-02-06 ENCOUNTER — Telehealth: Payer: Self-pay | Admitting: Pulmonary Disease

## 2013-02-06 ENCOUNTER — Encounter: Payer: Self-pay | Admitting: Gastroenterology

## 2013-02-06 DIAGNOSIS — K921 Melena: Secondary | ICD-10-CM

## 2013-02-06 NOTE — Telephone Encounter (Signed)
Per SN---  Needs a follow up GI eval with Dr. Jarold Motto

## 2013-02-06 NOTE — Telephone Encounter (Signed)
Pt aware of recs. Referral palced

## 2013-02-06 NOTE — Telephone Encounter (Signed)
lmomtcb x1 

## 2013-02-06 NOTE — Telephone Encounter (Signed)
I spoke with pt. She stated for the past 2-3 weeks she has had blood in her stools. She goes about one time a day and this has happened everyday and is wanting to know if there are any tests that can be done about this. Please advise SN thanks Last OV 05/23/12 Pending 05/23/13  Allergies  Allergen Reactions  . Alendronate Sodium     REACTION: pt states INTOL w/ joint pains  . Losartan Potassium     REACTION: frequent urination

## 2013-02-17 ENCOUNTER — Encounter: Payer: Self-pay | Admitting: *Deleted

## 2013-02-23 ENCOUNTER — Ambulatory Visit (INDEPENDENT_AMBULATORY_CARE_PROVIDER_SITE_OTHER): Payer: Medicare Other | Admitting: Gastroenterology

## 2013-02-23 ENCOUNTER — Encounter: Payer: Self-pay | Admitting: Gastroenterology

## 2013-02-23 VITALS — BP 150/78 | HR 63 | Ht 62.0 in | Wt 192.8 lb

## 2013-02-23 DIAGNOSIS — Z86010 Personal history of colonic polyps: Secondary | ICD-10-CM

## 2013-02-23 DIAGNOSIS — Z800 Family history of malignant neoplasm of digestive organs: Secondary | ICD-10-CM

## 2013-02-23 DIAGNOSIS — K625 Hemorrhage of anus and rectum: Secondary | ICD-10-CM

## 2013-02-23 MED ORDER — MOVIPREP 100 G PO SOLR: 1.0000 | Freq: Once | ORAL | Status: DC

## 2013-02-23 NOTE — Patient Instructions (Signed)

## 2013-02-23 NOTE — Progress Notes (Signed)
This is a T67-year-old Caucasian female with a very strong family history of colon cancer who had a large adenoma removed from her right colon one year ago.  She now reports one episode of asymptomatic bright red blood per rectum.  Otherwise she denies GI complaints.  Review of her chart shows no anemia other lab abnormalities.  Her appetite is good her weight is stable.  She denies upper GI or hepatobiliary problems.  Current Medications, Allergies, Past Medical History, Past Surgical History, Family History and Social History were reviewed in [employer] record.  ROS: All systems were reviewed and are negative unless otherwise stated in the HPI.          Physical Exam: The patient in no distress blood pressure 150/78, pulse 73, and weight 192 with a BMI of 35.25.  Chest is clear she is in a regular rhythm without murmurs gallops or rubs.  There is no organomegaly, abdominal masses or tenderness.  Bowel sounds are normal.  Inspection the rectum shows some skin tags but no fissures or fistulae.  Rectal exam shows no masses or tenderness with soft stool which is guaiac negative.  Mental status normal.  Peripheral extremities unremarkable.   Assessment and Plan: Probable internal hemorrhoidal bleeding in a 67 year old patient who has a very strong family history of colon cancer and multiple family members.  She is very concerned about colon cancer, she did have a large right colon adenoma removed one year ago, and is due for followup colonoscopy in any case.  This is been scheduled at her convenience.  She is to continue her other medications as listed and reviewed. Encounter Diagnoses  Name Primary?  . Personal history of colonic polyps Yes  . Rectal bleeding

## 2013-03-08 ENCOUNTER — Ambulatory Visit (AMBULATORY_SURGERY_CENTER): Payer: Medicare Other | Admitting: Gastroenterology

## 2013-03-08 ENCOUNTER — Encounter: Payer: Self-pay | Admitting: Gastroenterology

## 2013-03-08 VITALS — BP 157/79 | HR 53 | Temp 96.5°F | Resp 16 | Ht 62.0 in | Wt 192.0 lb

## 2013-03-08 DIAGNOSIS — K625 Hemorrhage of anus and rectum: Secondary | ICD-10-CM

## 2013-03-08 DIAGNOSIS — D126 Benign neoplasm of colon, unspecified: Secondary | ICD-10-CM

## 2013-03-08 DIAGNOSIS — Z860100 Personal history of colon polyps, unspecified: Secondary | ICD-10-CM

## 2013-03-08 DIAGNOSIS — Z800 Family history of malignant neoplasm of digestive organs: Secondary | ICD-10-CM

## 2013-03-08 MED ORDER — SODIUM CHLORIDE 0.9 % IV SOLN: 500.0000 mL | INTRAVENOUS | Status: DC

## 2013-03-08 NOTE — Progress Notes (Signed)
Patient did not experience any of the following events: a burn prior to discharge; a fall within the facility; wrong site/side/patient/procedure/implant event; or a hospital transfer or hospital admission upon discharge from the facility. (G8907) Patient did not have preoperative order for IV antibiotic SSI prophylaxis. (G8918)  

## 2013-03-08 NOTE — Op Note (Signed)
[facility] [address] No Name [place], [address]   COLONOSCOPY PROCEDURE REPORT  PATIENT: Torra, Pala  MR#: [id number] BIRTHDATE: 1945/12/23 , 67  yrs. old GENDER: Female ENDOSCOPIST: Mardella Layman, MD, [facility] REFERRED BY: PROCEDURE DATE:  03/08/2013 PROCEDURE:   Colonoscopy, surveillance ASA CLASS:   Class II INDICATIONS:Patient's personal history of adenomatous colon polyps. Large right polyp removed 1 year ago. MEDICATIONS: propofol (Diprivan) 200mg  IV  DESCRIPTION OF PROCEDURE:   After the risks and benefits and of the procedure were explained, informed consent was obtained.  A digital rectal exam revealed no abnormalities of the rectum.    The LB [id number]  endoscope was introduced through the anus and advanced to the cecum, which was identified by both the appendix and ileocecal valve .  The quality of the prep was excellent, using MoviPrep .  The instrument was then slowly withdrawn as the colon was fully examined.     COLON FINDINGS: A normal appearing cecum, ileocecal valve, and appendiceal orifice were identified.  The ascending, hepatic flexure, transverse, splenic flexure, descending, sigmoid colon and rectum appeared unremarkable.  No polyps or cancers were seen. Retroflexed views revealed no abnormalities.     The scope was then withdrawn from the patient and the procedure completed.  COMPLICATIONS: There were no complications. ENDOSCOPIC IMPRESSION: Normal colon,,,no recurrent polyps noted.  RECOMMENDATIONS: 1.  Repeat Colonoscopy in 5 years. 2.  Continue current medications   REPEAT EXAM:  ZD:GLOVF Elayne Snare, MD  _______________________________ eSigned:  Mardella Layman, MD, [facility] 03/08/2013 8:44 AM     PATIENT NAME:  Brier, Firebaugh MR#: [id number]

## 2013-03-08 NOTE — Patient Instructions (Signed)
YOU HAD AN ENDOSCOPIC PROCEDURE TODAY AT THE [facility]: Refer to the procedure report that was given to you for any specific questions about what was found during the examination.  If the procedure report does not answer your questions, please call your gastroenterologist to clarify.  If you requested that your care partner not be given the details of your procedure findings, then the procedure report has been included in a sealed envelope for you to review at your convenience later.  YOU SHOULD EXPECT: Some feelings of bloating in the abdomen. Passage of more gas than usual.  Walking can help get rid of the air that was put into your GI tract during the procedure and reduce the bloating. If you had a lower endoscopy (such as a colonoscopy or flexible sigmoidoscopy) you may notice spotting of blood in your stool or on the toilet paper. If you underwent a bowel prep for your procedure, then you may not have a normal bowel movement for a few days.  DIET: Your first meal following the procedure should be a light meal and then it is ok to progress to your normal diet.  A half-sandwich or bowl of soup is an example of a good first meal.  Heavy or fried foods are harder to digest and may make you feel nauseous or bloated.  Likewise meals heavy in dairy and vegetables can cause extra gas to form and this can also increase the bloating.  Drink plenty of fluids but you should avoid alcoholic beverages for 24 hours.  ACTIVITY: Your care partner should take you home directly after the procedure.  You should plan to take it easy, moving slowly for the rest of the day.  You can resume normal activity the day after the procedure however you should NOT DRIVE or use heavy machinery for 24 hours (because of the sedation medicines used during the test).    SYMPTOMS TO REPORT IMMEDIATELY: A gastroenterologist can be reached at any hour.  During normal business hours, 8:30 AM to 5:00 PM Monday through Friday,  call [phone].  After hours and on weekends, please call the GI answering service at [phone] who will take a message and have the physician on call contact you.   Following lower endoscopy (colonoscopy or flexible sigmoidoscopy):  Excessive amounts of blood in the stool  Significant tenderness or worsening of abdominal pains  Swelling of the abdomen that is new, acute  Fever of 100F or higher    FOLLOW UP: If any biopsies were taken you will be contacted by phone or by letter within the next 1-3 weeks.  Call your gastroenterologist if you have not heard about the biopsies in 3 weeks.  Our staff will call the home number listed on your records the next business day following your procedure to check on you and address any questions or concerns that you may have at that time regarding the information given to you following your procedure. This is a courtesy call and so if there is no answer at the home number and we have not heard from you through the emergency physician on call, we will assume that you have returned to your regular daily activities without incident.  SIGNATURES/CONFIDENTIALITY: You and/or your care partner have signed paperwork which will be entered into your electronic medical record.  These signatures attest to the fact that that the information above on your After Visit Summary has been reviewed and is understood.  Full responsibility of the confidentiality   of this discharge information lies with you and/or your care-partner.     

## 2013-03-09 ENCOUNTER — Telehealth: Payer: Self-pay | Admitting: *Deleted

## 2013-03-09 NOTE — Telephone Encounter (Signed)
  Follow up Call-  Call back number 03/08/2013 10/14/2011  Post procedure Call Back phone  # [phone] ok mess  Permission to leave phone message Yes -     Patient questions:  Do you have a fever, pain , or abdominal swelling? no Pain Score  0 *  Have you tolerated food without any problems? yes  Have you been able to return to your normal activities? yes  Do you have any questions about your discharge instructions: Diet   no Medications  no Follow up visit  no  Do you have questions or concerns about your Care? no  Actions: * If pain score is 4 or above: No action needed, pain <4.

## 2013-03-29 ENCOUNTER — Other Ambulatory Visit (HOSPITAL_COMMUNITY)
Admission: RE | Admit: 2013-03-29 | Discharge: 2013-03-29 | Disposition: A | Discharge status: Home / Self Care | Payer: Medicare Other | Source: Ambulatory Visit | Attending: Gynecology | Admitting: Gynecology

## 2013-03-29 ENCOUNTER — Encounter: Payer: Self-pay | Admitting: Gynecology

## 2013-03-29 ENCOUNTER — Ambulatory Visit (INDEPENDENT_AMBULATORY_CARE_PROVIDER_SITE_OTHER): Payer: Medicare Other | Admitting: Gynecology

## 2013-03-29 VITALS — BP 120/76 | Ht 61.0 in | Wt 194.0 lb

## 2013-03-29 DIAGNOSIS — N952 Postmenopausal atrophic vaginitis: Secondary | ICD-10-CM

## 2013-03-29 DIAGNOSIS — M899 Disorder of bone, unspecified: Secondary | ICD-10-CM

## 2013-03-29 DIAGNOSIS — M8580 Other specified disorders of bone density and structure, unspecified site: Secondary | ICD-10-CM

## 2013-03-29 DIAGNOSIS — C549 Malignant neoplasm of corpus uteri, unspecified: Secondary | ICD-10-CM

## 2013-03-29 DIAGNOSIS — C541 Malignant neoplasm of endometrium: Secondary | ICD-10-CM

## 2013-03-29 NOTE — Patient Instructions (Signed)
Followup for bone density as scheduled. 

## 2013-03-29 NOTE — Addendum Note (Signed)
Addended by: Dayna Barker on: 03/29/2013 11:28 AM   Modules accepted: Orders

## 2013-03-29 NOTE — Progress Notes (Signed)
Margaret Evans September 26, 1946 [id number]        67 y.o.  G1P1001 for followup exam.  Several issues noted below.  Past medical history,surgical history, medications, allergies, family history and social history were all reviewed and documented in the EPIC chart.  ROS:  Performed and pertinent positives and negatives are included in the history, assessment and plan .  Exam: Kim assistant Filed Vitals:   03/29/13 1046  BP: 120/76  Height: 5\' 1"  (1.549 m)  Weight: 194 lb (87.998 kg)   General appearance  Normal Skin grossly normal Head/Neck normal with no cervical or supraclavicular adenopathy thyroid normal Lungs  clear Cardiac RR, without RMG Abdominal  soft, nontender, without masses, organomegaly or hernia Breasts  examined lying and sitting without masses, retractions, discharge or axillary adenopathy. Pelvic  Ext/BUS/vagina  normal with atrophic changes. Pap of cuff done  Adnexa  Without masses or tenderness    Anus and perineum  normal   Rectovaginal  normal sphincter tone without palpated masses or tenderness.    Assessment/Plan:  67 y.o. G58P1001 female for followup exam.   1. 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 Exam today is normal with NED. Pap of cuff done. 2. Osteopenia. DEXA 04/2011 with T score -2.3 FRAX 11%/2.1%. Repeat DEXA now at two-year interval. Increased calcium vitamin D reviewed. 3. Mammography 08/2012. Continue with annual mammography. SBE monthly reviewed. 4. Colonoscopy 03/2013. Followup with their recommended interval. 5. Health maintenance. No lab work done as it is all done through her primary physician's office. Followup one year, sooner as needed.     Dara Lords MD, 11:14 AM 03/29/2013

## 2013-03-30 ENCOUNTER — Encounter: Payer: Self-pay | Admitting: Gynecology

## 2013-05-10 ENCOUNTER — Other Ambulatory Visit: Payer: Self-pay

## 2013-05-23 ENCOUNTER — Encounter: Payer: Self-pay | Admitting: Pulmonary Disease

## 2013-05-23 ENCOUNTER — Other Ambulatory Visit (INDEPENDENT_AMBULATORY_CARE_PROVIDER_SITE_OTHER): Payer: Medicare Other

## 2013-05-23 ENCOUNTER — Ambulatory Visit (INDEPENDENT_AMBULATORY_CARE_PROVIDER_SITE_OTHER): Payer: Medicare Other | Admitting: Pulmonary Disease

## 2013-05-23 VITALS — BP 134/82 | HR 56 | Temp 98.0°F | Ht 61.0 in | Wt 194.8 lb

## 2013-05-23 DIAGNOSIS — I10 Essential (primary) hypertension: Secondary | ICD-10-CM

## 2013-05-23 DIAGNOSIS — F419 Anxiety disorder, unspecified: Secondary | ICD-10-CM

## 2013-05-23 DIAGNOSIS — F411 Generalized anxiety disorder: Secondary | ICD-10-CM

## 2013-05-23 DIAGNOSIS — M545 Low back pain, unspecified: Secondary | ICD-10-CM

## 2013-05-23 DIAGNOSIS — K5730 Diverticulosis of large intestine without perforation or abscess without bleeding: Secondary | ICD-10-CM

## 2013-05-23 DIAGNOSIS — D126 Benign neoplasm of colon, unspecified: Secondary | ICD-10-CM

## 2013-05-23 DIAGNOSIS — M899 Disorder of bone, unspecified: Secondary | ICD-10-CM

## 2013-05-23 DIAGNOSIS — E785 Hyperlipidemia, unspecified: Secondary | ICD-10-CM

## 2013-05-23 DIAGNOSIS — J309 Allergic rhinitis, unspecified: Secondary | ICD-10-CM

## 2013-05-23 LAB — LIPID PANEL
Cholesterol: 150 mg/dL (ref 0–200)
HDL: 40.5 mg/dL (ref >39.00)
LDL Cholesterol: 79 mg/dL (ref 0–99)
Triglycerides: 152 mg/dL — ABNORMAL HIGH (ref 0.0–149.0)
VLDL: 30.4 mg/dL (ref 0.0–40.0)

## 2013-05-23 LAB — HEPATIC FUNCTION PANEL: Total Bilirubin: 0.8 mg/dL (ref 0.3–1.2)

## 2013-05-23 LAB — CBC WITH DIFFERENTIAL/PLATELET
Basophils Relative: 0.6 % (ref 0.0–3.0)
Eosinophils Relative: 1.3 % (ref 0.0–5.0)
HCT: 44.2 % (ref 36.0–46.0)
Lymphs Abs: 2.4 10*3/uL (ref 0.7–4.0)
MCV: 90.4 fl (ref 78.0–100.0)
Monocytes Absolute: 0.6 10*3/uL (ref 0.1–1.0)
Monocytes Relative: 6.1 % (ref 3.0–12.0)
Neutrophils Relative %: 66.6 % (ref 43.0–77.0)
RBC: 4.89 Mil/uL (ref 3.87–5.11)
WBC: 9.6 10*3/uL (ref 4.5–10.5)

## 2013-05-23 LAB — BASIC METABOLIC PANEL
BUN: 13 mg/dL (ref 6–23)
Creatinine, Ser: 0.7 mg/dL (ref 0.4–1.2)
GFR: 88.28 mL/min (ref >60.00)

## 2013-05-23 MED ORDER — SIMVASTATIN 40 MG PO TABS: 40.0000 mg | ORAL_TABLET | Freq: Every day | ORAL | Status: DC

## 2013-05-23 NOTE — Progress Notes (Signed)
Subjective:    Patient ID: Margaret Evans, female    DOB: 04-12-1946, 67 y.o.   MRN: [id number]  HPI 67 y/o WF here for a follow up visit...   ~  May11:  she has been doing well and has no new complaints or concerns today... she notes some intermittent heel pain improved w/ inserts & stretching exercises...  ~  October 28, 2010:  Margaret Evans callled in Dec w/ elev BP at home in the 150-160 range assoc w/ mild HA... we perscribed Losartan 50mg  but she developed urinary symptom w/ burning discomfort that she related to the new Rx so we switched her to METOPROLOL 50mg /d... pharm filled the tartrate which she's been taking once daily in AM & her nadir level BP's have all been good> 120-140/ 70 at home (we discussed this & OK to continue same & monitor BP in eve as well)...   she also had URI/ AB 12/11 & saw TP w/ ZPak, Mucinex, Hydromet Rx & resolved...  ~  May 25, 2011:  38mo ROV & she tells me that the Metoprolol caused a severe dry mouth & she had to stop it> now controlling BP w/ diet alone & BP checks have been good averaging 130s/ 70s at home;  Unfortunately she is really NOT dieting or exercising & we discussed this in detail> rec low carb, low fat, low sodium, wt reducing diet & exercise program eg- silver sneakers etc...    She remains on ASA daily; denies CP, palpit, dizzy, SOB, edema, etc; BP today = 122/76 & doing satis, perhaps some mild fatigue & she would like yearly labs checked fasting today;  She is tolerating Simva40;  DrFontaine, GYN does her BMDs thru his office & last was 6/12 by pt's report "the same, no change";  She is due for a 65yr f/u colon this yr & we will send reminder to [employer]...  ~  May 23, 2012:  Yearly ROV & Margaret Evans is tearful w/ mother recently dx w/ brain tumor (Glioblastoma) & being treated in [place]; I offered pt an anxiolytic med but she declines at this time & we left the door open should she need med later on...    AR>  Stable on OTC antihist etc...    HBP>   Prev on meds but stopped due to perceived side effects; BP= 152/84 & she denies CP, palpit, SOB, edema; we decided to check 2DEcho before deciding on med rx ==> pending    Abn EKG>  On ASA81mg /d; EKG shows SBrady, rate52, poor R prog V1-3 & NSSTTWA; 2DEcho is pending...    Hyperlipid>  On Simva40; FLP showed TChol     GI> Divertics, polyps>  She had f/u colonoscopy 1/13 by DRP w/ divertics & one sessile polyp in cecum; Bx= tubular adenoma & f/u planned 49yrs.    Hx LBP & Osteopenia>  No further back pain, but BMD 7/12 by DrFontaine showed TScore -2.3 in right [facility]; she's been intol to Fosamax & he rec incr in Calc & VitD, plus weight bearing exercise; Vit D level =     We reviewed prob list, meds, xrays and labs> see below>> EKG 8/13 shows SBrady, rate52, poor R prog V1-3 & NSSTTWA... LABS 8/13:  FLP- at goals on Simva40;  Chems- wnl;  CBC- wnl;  TSH=1.07;  VitD=52 on 1000u supplement. 2DEcho ==> Done 05/30/12:  Normal LV size & function w/ EF=65-70%, normal wall thickness, Gr1DD, normal valves, etc... Therefore doesn't need BP meds now but needs  better diet- no salt- keep wt down- monitor BP at home & call if BP regularly 150/90 or higher to initiate med rx!   ~  May 23, 2013:  Yearly ROV & Margaret Evans notes that her mother passed away this past yr at 21 w/ an 69mo battle vs Glioblastoma (she underwent surg, XRT, ChemoRx);  Margaret Evans has no new complaints or concerns;  We reviewed the following medical problems during today's office visit >>     AR>  Stable on OTC antihist etc...    HBP>  Prev on meds but stopped due to perceived side effects; BP= 134/82 & she denies CP, palpit, SOB, edema; we reviewed diet, exercise, no salt, wt reduction...    Abn EKG>  On ASA81mg /d; EKG shows SBrady, rate52, poor R prog V1-3 & NSSTTWA; 2DEcho showed norm LV size & function w/ EF=65-70% & no regional wall motion abn...    Hyperlipid>  On Simva40; FLP showed TChol 150, TG 152, HDL 41, LDL 79    GI> Divertics, polyps>   She had f/u colonoscopy 1/13 by DRP w/ divertics & one sessile polyp in cecum; Bx= tubular adenoma & f/u planned 13yrs; she had another colon 6/14 (due to BRB) that was neg- no recurrent polyp...     Hx LBP & Osteopenia>  No further back pain, but BMD 7/12 by DrFontaine showed TScore -2.3 in right [facility]; she's been intol to Fosamax & he rec incr in Calc & VitD, plus weight bearing exercise; Vit D level 8/13= 52 We reviewed prob list, meds, xrays and labs> see below for updates >>  LABS 8/14:  FLP- ok on Simva40;  Chems- wnl;  CBC- wnl;  TSH=1.21;  VitD=55...           Problem List:     ALLERGIC RHINITIS (ICD-477.9) - stable, no recent problems, OTC Rx w/ Zyrtek/ Claritin Prn.  Hx of DIZZINESS (ICD-780.4) - no recent problems...  HYPERTENSION (ICD-401.9) - new dx 12/11 w/ home BP checks ~150-160 sys & assoc mild HA... she was tried on Losartan 50mg  w/ urinary burning symptoms that she thought was a side effect; changed to Metoprolol50 & improved but she developed severe dry mouth from this medication & had to stop it; now controlled on diet alone & her home BP checks ave 130/70- OK... ~  CXR 5/11 showed heart at upper lim of norm, clear lungs, NAD.Marland Kitchen. ~  5/12:  BP= 122/76 off meds & she denies CP, palpit, SOB, edema, etc... ~  8/13:  BP= 152/84 off meds & we will check 2DEcho to help US guide antihypertensive therapy... ~  2DEcho 8/13 showed normal LV size & function w/ EF=65-70%, normal wall thickness, Gr1DD, normal valves, etc... Therefore doesn't need BP meds now but needs better diet- no salt- keep wt down- monitor BP at home & call if BP regularly 150/90 or higher to initiate med rx! ~  8/14:  Bp= 134/82 on diet alone but she has gained 5# & cautioned about sodium, diet, exercise 7 wt reduction to avouid meds in the future...  ABNORMAL EKG >> ~  EKG 8/13 showed SBrady, rate52, poor R prog V1-3 & NSSTTWA... ~  2DEcho 8/13 ==> see above ~  She denies CP, palpit, SOB, dizzy, edema; advised to  incr exercise program etc...  HYPERLIPIDEMIA (ICD-272.4) - on SIMVASTATIN 40mg /d now... tol well. ~  FLP 11/07 on Lipitor 20mg /d showed TChol 171, TG 116, HDL 45, LDL 103 ~  FLP 3/09 on Simva40 showed TChol 176, TG  172, HDL 37, LDL 104 ~  FLP 5/10 (wt=189#) on Simva40 showed TChol 181, TG 146, HDL 43, LDL 109 ~  FLP 5/11 (wt=189#) on Simva40 showed TChol 172, TG 159, HDL 42, LDL 98 ~  FLP 8/12 (wt=190#) on simva40 showed TChol 164, TG 152, HDL 47, LDL 87 ~  FLP 8/13 (wt=190#) on Simva40 showed TChol 149, TG 146, HDL 46, LDL 73 ~  FLP 8/14 (wt=195#) on Simva40 showed TChol 150, TG 152, HDL 41, LDL 79  DIVERTICULOSIS >> COLON POLYP >> ~  She had baseline Colonoscopy by DrPatterson 2002- neg, and f/u planned 32yrs... ~  She had routine f/u colonoscopy 1/13 by DrPatterson w/ mild divertics and one sessile polyp in cecum; Bx= tubular adenoma & f/u planned 71yrs per DRP. ~  6/14:  She had another colonoscopy by DrPatterson for an epis of BRBPR- neg colon no lesions seen, she wants f/u colon again in 3 yrs due to family hx...  Hx of HEPATITIS (ICD-573.3) - hx jaundice in 8th grade "alot of kids had it"... ? etiology (?HepA?), no known sequellae... all LFT's have been normal...  Hx ENDOMETRIAL ADENOCARCINOMA >> followed by Gyn= DrFontaine w/ surg 1996...  Hx of LUMBAGO (ICD-724.2) - seen 3/08 w/ LBP... rx'd w/ Robaxin, heat, Vicodin & improved... does exercises to keep in shape... no recent LBP complaints.  OSTEOPENIA (ICD-733.90) - followed by DrFontaine and SER ([facility])... we don't have a copy of her last BMD, but in 2006 her TScores were -1.2 to -2.0.Marland KitchenMarland Kitchen she was intol to Fosamax w/ joint pain that resolved off this Rx... DrFontaine has opted for Ca++, Vits, and observation... we discussed Osteoporosis in detail and all the diff meds and implications for the future... she may wish to try Boniva & will discuss this w/ DrFontaine. ~  labs 3/09 showed Vit D level = 28... rec> start 1000 u OTC daily. ~   labs 5/10 showed Vit D level = 33... continue supplement. ~  1/12:  she reports 68 y/o sis had "fosamax fx" after being on it just 8yrs. ~  She gets f/u BMDs thru DrFontaine's office> done 7/12 w/ lowest TScore -2.3 in right [facility]; He increased her Calcium & VitD noting INTOL to Fosamax in past & sister had atyp fractures from Fosamax Rx... They plan repeat 7/14. ~  Labs 8/13 showed Vit D level = 52... Rec> continue 1000u supplement daily. ~  Labs 8/14 showed Vit D level =    They are planning f/u BMD 10/14 she tells me...  DERM:  Hx of plantar wart, and poison ivy sensitivity...   Health Maintenance:   ~  GI:  Followed by DrPatterson and she is up to date on colonoscopies (see above). ~  GYN:  DrFontaine-  Hx cervical cancer w/ hysterectomy 1996; She has regular f/u w/ DrFontaine w/ PAP etc;  +fam hx breast cancer in sis, neg Mammograms yearly at [facility] (last- 11/13);  BMD followed by GYN/ Fontaine. ~  Vaccinations:  she gets yearly Flu vaccines;  Pneumovax given 02/27/09;  given TDAP 02/28/10 here;  had Shingles vacc 8/09 at [facility] w/ local reaction on arm...   Past Surgical History  Procedure Laterality Date  . Vaginal hysterectomy  1996    LAVH BSO  . Oophorectomy      BSO    Outpatient Encounter Prescriptions as of 05/23/2013  Medication Sig Dispense Refill  . aspirin 81 MG tablet Take 81 mg by mouth daily.       Marland Kitchen  Calcium Carbonate-Vitamin D 600-400 MG-UNIT per tablet Take 1 tablet by mouth 2 (two) times daily.        . Cholecalciferol (VITAMIN D) 1000 UNITS capsule Take 1,000 Units by mouth daily.        . Multiple Vitamin (MULTIVITAMIN) tablet Take 1 tablet by mouth daily.        . simvastatin (ZOCOR) 40 MG tablet Take 1 tablet (40 mg total) by mouth at bedtime.  90 tablet  3   No facility-administered encounter medications on file as of 05/23/2013.    Allergies  Allergen Reactions  . Alendronate Sodium     REACTION: pt states INTOL w/ joint pains  . Losartan Potassium      REACTION: frequent urination    Current Medications, Allergies, Past Medical History, Past Surgical History, Family History, and Social History were reviewed in [employer] record.    Review of Systems         See HPI - all other systems neg except as noted...  The patient denies anorexia, fever, weight loss, weight gain, vision loss, decreased hearing, hoarseness, chest pain, syncope, dyspnea on exertion, peripheral edema, prolonged cough, headaches, hemoptysis, abdominal pain, melena, hematochezia, severe indigestion/heartburn, hematuria, incontinence, muscle weakness, suspicious skin lesions, transient blindness, difficulty walking, depression, unusual weight change, abnormal bleeding, enlarged lymph nodes, and angioedema.     Objective:   Physical Exam     WD, Overweight, 67 y/o WF in NAD... GENERAL:  Alert & oriented; pleasant & cooperative. HEENT:  [place]/AT, EOM-wnl, PERRLA, EACs-clear, TMs-wnl, NOSE-clear, THROAT-clear & wnl. NECK:  Supple w/ full ROM; no JVD; normal carotid impulses w/o bruits; no thyromegaly or nodules palpated; no lymphadenopathy. CHEST:  Clear to P & A; without wheezes/ rales/ or rhonchi. HEART:  Regular Rhythm; without murmurs/ rubs/ or gallops. ABDOMEN:  Soft & nontender; normal bowel sounds; no organomegaly or masses detected. EXT: without deformities or arthritic changes; no varicose veins/ +venous insuffic/ or edema. NEURO:  CN's intact; motor testing normal; sensory testing normal; gait normal & balance OK. DERM:  No lesions noted; no rash etc...  RADIOLOGY DATA:  Reviewed in the EPIC EMR & discussed w/ the patient...  LABORATORY DATA:  Reviewed in the EPIC EMR & discussed w/ the patient...   Assessment & Plan:    Hx Borderline HBP>  Controlled on diet alone for now; advised low carb, low fat, wt reducing diet; avoid salt/ sodium as well... 2DEcho did NOT show any LVH etc & she doesn't want meds but will monitor BP at home &  call if >150/90...  HYPERLIPIDEMIA>  FLP looks ok on Simva40 & needs better low fat diet...  Hx Hepatitis in 8th grade>  LFTs remain normal...  Hx LBP>  No recent problems, advised to increase exercise & get wt down (strengthen her "core")...  Osteopenia>  Followed & managed by DrFontaine... On Calcium, MVI, Vit D...  Health Maint>  She is up to date on vaccinations & will get the 2013 Flu vaccine when avail...   Patient's Medications  New Prescriptions   No medications on file  Previous Medications   ASPIRIN 81 MG TABLET    Take 81 mg by mouth daily.    CALCIUM CARBONATE-VITAMIN D 600-400 MG-UNIT PER TABLET    Take 1 tablet by mouth 2 (two) times daily.     CHOLECALCIFEROL (VITAMIN D) 1000 UNITS CAPSULE    Take 1,000 Units by mouth daily.     MULTIPLE VITAMIN (MULTIVITAMIN) TABLET  Take 1 tablet by mouth daily.    Modified Medications   Modified Medication Previous Medication   SIMVASTATIN (ZOCOR) 40 MG TABLET simvastatin (ZOCOR) 40 MG tablet      Take 1 tablet (40 mg total) by mouth at bedtime.    Take 1 tablet (40 mg total) by mouth at bedtime.  Discontinued Medications   No medications on file

## 2013-05-23 NOTE — Patient Instructions (Addendum)
Today we updated your med list in our EPIC system...    Continue your current medications the same...  Today we did your follow up FASTING blood work...    We will contact you w/ the results when available...   Let's get on track w/ our diet 7 exercise program...    The goal is to lose 15-20 lbs...  Call for any questions...  Let's plan a follow up visit in 1yr, sooner if needed for problems...   

## 2013-05-24 ENCOUNTER — Telehealth: Payer: Self-pay | Admitting: Pulmonary Disease

## 2013-05-24 LAB — VITAMIN D 25 HYDROXY (VIT D DEFICIENCY, FRACTURES): Vit D, 25-Hydroxy: 55 ng/mL (ref 30–89)

## 2013-05-24 NOTE — Telephone Encounter (Signed)
Notes Recorded by Michele Mcalpine, MD on 05/24/2013 at 7:55 AM Please notify patient>  FLP looks ok on Simva40; Rec- continue same + better low fat diet! Chems, LFTs, CBC, Thyroid, VitD> all WNL.Marland KitchenMarland Kitchen --  I spoke with patient about results and she verbalized understanding and had no questions

## 2013-07-05 ENCOUNTER — Telehealth: Payer: Self-pay | Admitting: Pulmonary Disease

## 2013-07-05 MED ORDER — AZITHROMYCIN 250 MG PO TABS: ORAL_TABLET | ORAL | Status: DC

## 2013-07-05 MED ORDER — DIPHENHYD-HYDROCORT-NYSTATIN MT SUSP: OROMUCOSAL | Status: DC

## 2013-07-05 NOTE — Telephone Encounter (Signed)
I spoke with pt. She c/o sore throat, hoarseness, slight cough when her throat gets dry, PND x couple days. She has been doing warm salt water gargles, chloraseptic spray, mucinex, tylenol, and multi symptom cold tablet OTC. Pt is requesting further recs. Please advise SN thanks  Allergies  Allergen Reactions  . Alendronate Sodium     REACTION: pt states INTOL w/ joint pains  . Losartan Potassium     REACTION: frequent urination

## 2013-07-05 NOTE — Telephone Encounter (Signed)
Per SN---  Ok to send in zpak #1  Take as directed MMW  #4oz  1 tsp gargle and swallow four times daily as needed.

## 2013-07-05 NOTE — Telephone Encounter (Signed)
I spoke with pt and is aware of recs. RX has been sent. Nothing further needed 

## 2013-07-10 ENCOUNTER — Telehealth: Payer: Self-pay | Admitting: Pulmonary Disease

## 2013-07-10 DIAGNOSIS — R490 Dysphonia: Secondary | ICD-10-CM

## 2013-07-10 DIAGNOSIS — J029 Acute pharyngitis, unspecified: Secondary | ICD-10-CM

## 2013-07-10 NOTE — Telephone Encounter (Signed)
I spoke with pt. I made her aware of SN recs. She voiced her understanding and referral placed. Nothing further needed

## 2013-07-10 NOTE — Telephone Encounter (Signed)
I spoke with Margaret Evans. She states she is still having sore throat, hoarseness, dry cough, and her throat feels raw. She finished the ZPAK and MMW called in for her on 07/05/13 and still having these symptoms. She is requesting further recs. Please advise SN thanks Last OV 05/23/13 Allergies  Allergen Reactions  . Alendronate Sodium     REACTION: Margaret Evans states INTOL w/ joint pains  . Losartan Potassium     REACTION: frequent urination

## 2013-07-10 NOTE — Telephone Encounter (Signed)
Per SN---  She will need to be checked by ENT if not better after the MMW and zpak.  Please schedule appt with ENT.  thanks

## 2013-08-10 ENCOUNTER — Other Ambulatory Visit: Payer: Self-pay

## 2013-08-29 ENCOUNTER — Ambulatory Visit (INDEPENDENT_AMBULATORY_CARE_PROVIDER_SITE_OTHER): Payer: Medicare Other

## 2013-08-29 DIAGNOSIS — M8580 Other specified disorders of bone density and structure, unspecified site: Secondary | ICD-10-CM

## 2013-08-29 DIAGNOSIS — M899 Disorder of bone, unspecified: Secondary | ICD-10-CM

## 2013-09-04 ENCOUNTER — Encounter: Payer: Self-pay | Admitting: Gynecology

## 2013-10-30 ENCOUNTER — Encounter: Payer: Self-pay | Admitting: Adult Health

## 2013-10-30 ENCOUNTER — Telehealth: Payer: Self-pay | Admitting: Pulmonary Disease

## 2013-10-30 ENCOUNTER — Other Ambulatory Visit (INDEPENDENT_AMBULATORY_CARE_PROVIDER_SITE_OTHER): Payer: Medicare Other

## 2013-10-30 ENCOUNTER — Ambulatory Visit (INDEPENDENT_AMBULATORY_CARE_PROVIDER_SITE_OTHER): Payer: Medicare Other | Admitting: Adult Health

## 2013-10-30 VITALS — BP 114/76 | HR 69 | Temp 99.2°F | Ht 60.5 in | Wt 193.0 lb

## 2013-10-30 DIAGNOSIS — R300 Dysuria: Secondary | ICD-10-CM

## 2013-10-30 DIAGNOSIS — N3000 Acute cystitis without hematuria: Secondary | ICD-10-CM

## 2013-10-30 LAB — URINALYSIS, ROUTINE W REFLEX MICROSCOPIC
BILIRUBIN URINE: NEGATIVE
HGB URINE DIPSTICK: NEGATIVE
Nitrite: NEGATIVE
Specific Gravity, Urine: >=1.03 — AB (ref 1.000–1.030)
Total Protein, Urine: NEGATIVE
UROBILINOGEN UA: 0.2 (ref 0.0–1.0)
Urine Glucose: NEGATIVE
pH: 5.5 (ref 5.0–8.0)

## 2013-10-30 MED ORDER — CIPROFLOXACIN HCL 250 MG PO TABS: 250.0000 mg | ORAL_TABLET | Freq: Two times a day (BID) | ORAL | Status: DC

## 2013-10-30 NOTE — Progress Notes (Signed)
Subjective:    Patient ID: Margaret Evans, female    DOB: [birth year], 68 y.o.   MRN: [id number]  HPI 68 y/o WF   10/30/2013 Follow up  Complains of low back burning pain, urine darker in color x7-10days, increased frequency today.    Last UTI years ago No n/v/d, hematuria , injury or radicular pains.  Some urinary urgency.  No fever or chest pain or abd pain.           Problem List:     ALLERGIC RHINITIS (ICD-477.9) - stable, no recent problems, OTC Rx w/ Zyrtek/ Claritin Prn.  Hx of DIZZINESS (ICD-780.4) - no recent problems...  HYPERTENSION (ICD-401.9) - new dx 12/11 w/ home BP checks ~150-160 sys & assoc mild HA... she was tried on Losartan 50mg  w/ urinary burning symptoms that she thought was a side effect; changed to Metoprolol50 & improved but she developed severe dry mouth from this medication & had to stop it; now controlled on diet alone & her home BP checks ave 130/70- OK... ~  CXR 5/11 showed heart at upper lim of norm, clear lungs, NAD.Marland Kitchen. ~  5/12:  BP= 122/76 off meds & she denies CP, palpit, SOB, edema, etc... ~  8/13:  BP= 152/84 off meds & we will check 2DEcho to help US guide antihypertensive therapy... ~  2DEcho 8/13 showed normal LV size & function w/ EF=65-70%, normal wall thickness, Gr1DD, normal valves, etc... Therefore doesn't need BP meds now but needs better diet- no salt- keep wt down- monitor BP at home & call if BP regularly 150/90 or higher to initiate med rx! ~  8/14:  Bp= 134/82 on diet alone but she has gained 5# & cautioned about sodium, diet, exercise 7 wt reduction to avouid meds in the future...  ABNORMAL EKG >> ~  EKG 8/13 showed SBrady, rate52, poor R prog V1-3 & NSSTTWA... ~  2DEcho 8/13 ==> see above ~  She denies CP, palpit, SOB, dizzy, edema; advised to incr exercise program etc...  HYPERLIPIDEMIA (ICD-272.4) - on SIMVASTATIN 40mg /d now... tol well. ~  [place] 11/07 on Lipitor 20mg /d showed TChol 171, TG 116, HDL 45, LDL 103 ~  FLP 3/09 on  Simva40 showed TChol 176, TG 172, HDL 37, LDL 104 ~  FLP 5/10 (wt=189#) on Simva40 showed TChol 181, TG 146, HDL 43, LDL 109 ~  FLP 5/11 (wt=189#) on Simva40 showed TChol 172, TG 159, HDL 42, LDL 98 ~  FLP 8/12 (wt=190#) on simva40 showed TChol 164, TG 152, HDL 47, LDL 87 ~  FLP 8/13 (wt=190#) on Simva40 showed TChol 149, TG 146, HDL 46, LDL 73 ~  FLP 8/14 (wt=195#) on Simva40 showed TChol 150, TG 152, HDL 41, LDL 79  DIVERTICULOSIS >> COLON POLYP >> ~  She had baseline Colonoscopy by DrPatterson 2002- neg, and f/u planned 34yrs... ~  She had routine f/u colonoscopy 1/13 by DrPatterson w/ mild divertics and one sessile polyp in cecum; Bx= tubular adenoma & f/u planned 82yrs per DRP. ~  6/14:  She had another colonoscopy by DrPatterson for an epis of BRBPR- neg colon no lesions seen, she wants f/u colon again in 3 yrs due to family hx...  Hx of HEPATITIS (ICD-573.3) - hx jaundice in 8th grade "alot of kids had it"... ? etiology (?HepA?), no known sequellae... all LFT's have been normal...  Hx ENDOMETRIAL ADENOCARCINOMA >> followed by Gyn= DrFontaine w/ surg 1996...  Hx of LUMBAGO (ICD-724.2) - seen 3/08 w/ LBP... rx'd w/ Robaxin,  heat, Vicodin & improved... does exercises to keep in shape... no recent LBP complaints.  OSTEOPENIA (ICD-733.90) - followed by DrFontaine and SER ([facility])... we don't have a copy of her last BMD, but in 2006 her TScores were -1.2 to -2.0.Marland KitchenMarland Kitchen she was intol to Fosamax w/ joint pain that resolved off this Rx... DrFontaine has opted for Ca++, Vits, and observation... we discussed Osteoporosis in detail and all the diff meds and implications for the future... she may wish to try Boniva & will discuss this w/ DrFontaine. ~  labs 3/09 showed Vit D level = 28... rec> start 1000 u OTC daily. ~  labs 5/10 showed Vit D level = 33... continue supplement. ~  1/12:  she reports 69 y/o sis had "fosamax fx" after being on it just 42yrs. ~  She gets f/u BMDs thru DrFontaine's office> done  7/12 w/ lowest TScore -2.3 in right [place]; He increased her Calcium & VitD noting INTOL to Fosamax in past & sister had atyp fractures from Fosamax Rx... They plan repeat 7/14. ~  Labs 8/13 showed Vit D level = 52... Rec> continue 1000u supplement daily. ~  Labs 8/14 showed Vit D level =    They are planning f/u BMD 10/14 she tells me...  DERM:  Hx of plantar wart, and poison ivy sensitivity...   Health Maintenance:   ~  GI:  Followed by DrPatterson and she is up to date on colonoscopies (see above). ~  GYN:  DrFontaine-  Hx cervical cancer w/ hysterectomy 1996; She has regular f/u w/ DrFontaine w/ PAP etc;  +fam hx breast cancer in sis, neg Mammograms yearly at [facility] (last- 11/13);  BMD followed by GYN/ Fontaine. ~  Vaccinations:  she gets yearly Flu vaccines;  Pneumovax given 02/27/09;  given TDAP 02/28/10 here;  had Shingles [facility] 8/09 [facility] w/ local reaction on arm...   Past Surgical History  Procedure Laterality Date  . Vaginal hysterectomy  1996    LAVH BSO  . Oophorectomy      BSO    Outpatient Encounter Prescriptions as of 10/30/2013  Medication Sig  . aspirin 81 MG tablet Take 81 mg by mouth daily.   Marland Kitchen azithromycin (ZITHROMAX) 250 MG tablet Take as directed  . Calcium Carbonate-Vitamin D 600-400 MG-UNIT per tablet Take 1 tablet by mouth 2 (two) times daily.    . Cholecalciferol (VITAMIN D) 1000 UNITS capsule Take 1,000 Units by mouth daily.    . Diphenhyd-Hydrocort-Nystatin SUSP 1 tsp gargle and swallow 4 times a day as needed  . Multiple Vitamin (MULTIVITAMIN) tablet Take 1 tablet by mouth daily.    . simvastatin (ZOCOR) 40 MG tablet Take 1 tablet (40 mg total) by mouth at bedtime.    Allergies  Allergen Reactions  . Alendronate Sodium     REACTION: pt states INTOL w/ joint pains  . Losartan Potassium     REACTION: frequent urination    Current Medications, Allergies, Past Medical History, Past Surgical History, Family History, and Social History were reviewed in  [facility] record.    Review of Systems         See HPI - all other systems neg except as noted...  The patient denies anorexia, fever, weight loss, weight gain, vision loss, decreased hearing, hoarseness, chest pain, syncope, dyspnea on exertion, peripheral edema, prolonged cough, headaches, hemoptysis, abdominal pain, melena, hematochezia, severe indigestion/heartburn, hematuria, incontinence, muscle weakness, suspicious skin lesions, transient blindness, difficulty walking, depression, unusual weight change, abnormal bleeding,  enlarged lymph nodes, and angioedema.     Objective:   Physical Exam     WD, Overweight, 68y/o WF in NAD... GENERAL:  Alert & oriented; pleasant & cooperative. HEENT:  [place]/AT,  EACs-clear, TMs-wnl, NOSE-clear, THROAT-clear & wnl. NECK:  Supple w/ full ROM; no JVD; normal carotid impulses w/o bruits; no thyromegaly or nodules palpated; no lymphadenopathy. CHEST:  Clear to P & A; without wheezes/ rales/ or rhonchi. HEART:  Regular Rhythm; without murmurs/ rubs/ or gallops. ABDOMEN:  Soft & nontender; normal bowel sounds; no organomegaly or masses detected. Neg CVA tenderness  EXT: without deformities or arthritic changes; no varicose veins/ +venous insuffic/ or edema.  UA +WBC , leuk , rare bacteria

## 2013-10-30 NOTE — Patient Instructions (Signed)
Cipro 250mg  Twice daily  For 7 days.  Push fluids  Urinary hygiene regimen as discussed  Please contact office for sooner follow up if symptoms do not improve or worsen or seek emergency care

## 2013-10-30 NOTE — Telephone Encounter (Signed)
Called and spoke with pt. She thinks she may have a UTI. She c/o lower back burning sensation. Pt is scheduled to come in and see TP this afternoon. Nothing further needed

## 2013-10-31 DIAGNOSIS — N3000 Acute cystitis without hematuria: Secondary | ICD-10-CM | POA: Insufficient documentation

## 2013-10-31 LAB — URINE CULTURE: Colony Count: 30000

## 2013-10-31 NOTE — Assessment & Plan Note (Addendum)
UA shows possible UTI  cx pending   Plan  Cipro 250mg  Twice daily  For 7 days.  Push fluids  Urinary hygiene regimen as discussed  Please contact office for sooner follow up if symptoms do not improve or worsen or seek emergency care

## 2013-11-02 NOTE — Progress Notes (Signed)
Quick Note:  Patient returned call. Advised of lab results / recs as stated by TP. Pt verbalized understanding and denied any questions. ______ 

## 2013-12-19 ENCOUNTER — Telehealth: Payer: Self-pay | Admitting: Pulmonary Disease

## 2013-12-19 MED ORDER — SIMVASTATIN 40 MG PO TABS: 40.0000 mg | ORAL_TABLET | Freq: Every day | ORAL | Status: AC

## 2013-12-19 NOTE — Telephone Encounter (Signed)
i called and made pt aware RX has been sent. Pt is already aware SN is retiring and in process of finding new PCP. Nothing further needed

## 2014-03-30 ENCOUNTER — Other Ambulatory Visit (HOSPITAL_COMMUNITY)
Admission: RE | Admit: 2014-03-30 | Discharge: 2014-03-30 | Disposition: A | Discharge status: Home / Self Care | Payer: Medicare Other | Source: Ambulatory Visit | Attending: Gynecology | Admitting: Gynecology

## 2014-03-30 ENCOUNTER — Ambulatory Visit (INDEPENDENT_AMBULATORY_CARE_PROVIDER_SITE_OTHER): Payer: Medicare Other | Admitting: Gynecology

## 2014-03-30 ENCOUNTER — Encounter: Payer: Self-pay | Admitting: Gynecology

## 2014-03-30 VITALS — BP 126/80 | Ht 61.0 in | Wt 194.0 lb

## 2014-03-30 DIAGNOSIS — N952 Postmenopausal atrophic vaginitis: Secondary | ICD-10-CM

## 2014-03-30 DIAGNOSIS — M949 Disorder of cartilage, unspecified: Secondary | ICD-10-CM

## 2014-03-30 DIAGNOSIS — C549 Malignant neoplasm of corpus uteri, unspecified: Secondary | ICD-10-CM

## 2014-03-30 DIAGNOSIS — C541 Malignant neoplasm of endometrium: Secondary | ICD-10-CM

## 2014-03-30 DIAGNOSIS — Z124 Encounter for screening for malignant neoplasm of cervix: Secondary | ICD-10-CM | POA: Diagnosis present

## 2014-03-30 DIAGNOSIS — M8580 Other specified disorders of bone density and structure, unspecified site: Secondary | ICD-10-CM

## 2014-03-30 DIAGNOSIS — M899 Disorder of bone, unspecified: Secondary | ICD-10-CM

## 2014-03-30 NOTE — Patient Instructions (Signed)
Followup in one year for annual exam, sooner if any issues.  You may obtain a copy of any labs that were done today by logging onto MyChart as outlined in the instructions provided with your AVS (after visit summary). The office will not call with normal lab results but certainly if there are any significant abnormalities then we will contact you.   Health Maintenance, Female A healthy lifestyle and preventative care can promote health and wellness.  Maintain regular health, dental, and eye exams.  Eat a healthy diet. Foods like vegetables, fruits, whole grains, low-fat dairy products, and lean protein foods contain the nutrients you need without too many calories. Decrease your intake of foods high in solid fats, added sugars, and salt. Get information about a proper diet from your caregiver, if necessary.  Regular physical exercise is one of the most important things you can do for your health. Most adults should get at least 150 minutes of moderate-intensity exercise (any activity that increases your heart rate and causes you to sweat) each week. In addition, most adults need muscle-strengthening exercises on 2 or more days a week.   Maintain a healthy weight. The body mass index (BMI) is a screening tool to identify possible weight problems. It provides an estimate of body fat based on height and weight. Your caregiver can help determine your BMI, and can help you achieve or maintain a healthy weight. For adults 20 years and older:  A BMI below 18.5 is considered underweight.  A BMI of 18.5 to 24.9 is normal.  A BMI of 25 to 29.9 is considered overweight.  A BMI of 30 and above is considered obese.  Maintain normal blood lipids and cholesterol by exercising and minimizing your intake of saturated fat. Eat a balanced diet with plenty of fruits and vegetables. Blood tests for lipids and cholesterol should begin at age 20 and be repeated every 5 years. If your lipid or cholesterol levels are  high, you are over 50, or you are a high risk for heart disease, you may need your cholesterol levels checked more frequently.Ongoing high lipid and cholesterol levels should be treated with medicines if diet and exercise are not effective.  If you smoke, find out from your caregiver how to quit. If you do not use tobacco, do not start.  Lung cancer screening is recommended for adults aged 55 80 years who are at high risk for developing lung cancer because of a history of smoking. Yearly low-dose computed tomography (CT) is recommended for people who have at least a 30-pack-year history of smoking and are a current smoker or have quit within the past 15 years. A pack year of smoking is smoking an average of 1 pack of cigarettes a day for 1 year (for example: 1 pack a day for 30 years or 2 packs a day for 15 years). Yearly screening should continue until the smoker has stopped smoking for at least 15 years. Yearly screening should also be stopped for people who develop a health problem that would prevent them from having lung cancer treatment.  If you are pregnant, do not drink alcohol. If you are breastfeeding, be very cautious about drinking alcohol. If you are not pregnant and choose to drink alcohol, do not exceed 1 drink per day. One drink is considered to be 12 ounces (355 mL) of beer, 5 ounces (148 mL) of wine, or 1.5 ounces (44 mL) of liquor.  Avoid use of street drugs. Do not share needles with   anyone. Ask for help if you need support or instructions about stopping the use of drugs.  High blood pressure causes heart disease and increases the risk of stroke. Blood pressure should be checked at least every 1 to 2 years. Ongoing high blood pressure should be treated with medicines, if weight loss and exercise are not effective.  If you are 55 to 69 years old, ask your caregiver if you should take aspirin to prevent strokes.  Diabetes screening involves taking a blood sample to check your fasting  blood sugar level. This should be done once every 3 years, after age 45, if you are within normal weight and without risk factors for diabetes. Testing should be considered at a younger age or be carried out more frequently if you are overweight and have at least 1 risk factor for diabetes.  Breast cancer screening is essential preventative care for women. You should practice "breast self-awareness." This means understanding the normal appearance and feel of your breasts and may include breast self-examination. Any changes detected, no matter how small, should be reported to a caregiver. Women in their 20s and 30s should have a clinical breast exam (CBE) by a caregiver as part of a regular health exam every 1 to 3 years. After age 40, women should have a CBE every year. Starting at age 40, women should consider having a mammogram (breast X-ray) every year. Women who have a family history of breast cancer should talk to their caregiver about genetic screening. Women at a high risk of breast cancer should talk to their caregiver about having an MRI and a mammogram every year.  Breast cancer gene (BRCA)-related cancer risk assessment is recommended for women who have family members with BRCA-related cancers. BRCA-related cancers include breast, ovarian, tubal, and peritoneal cancers. Having family members with these cancers may be associated with an increased risk for harmful changes (mutations) in the breast cancer genes BRCA1 and BRCA2. Results of the assessment will determine the need for genetic counseling and BRCA1 and BRCA2 testing.  The Pap test is a screening test for cervical cancer. Women should have a Pap test starting at age 21. Between ages 21 and 29, Pap tests should be repeated every 2 years. Beginning at age 30, you should have a Pap test every 3 years as long as the past 3 Pap tests have been normal. If you had a hysterectomy for a problem that was not cancer or a condition that could lead to  cancer, then you no longer need Pap tests. If you are between ages 65 and 70, and you have had normal Pap tests going back 10 years, you no longer need Pap tests. If you have had past treatment for cervical cancer or a condition that could lead to cancer, you need Pap tests and screening for cancer for at least 20 years after your treatment. If Pap tests have been discontinued, risk factors (such as a new sexual partner) need to be reassessed to determine if screening should be resumed. Some women have medical problems that increase the chance of getting cervical cancer. In these cases, your caregiver may recommend more frequent screening and Pap tests.  The human papillomavirus (HPV) test is an additional test that may be used for cervical cancer screening. The HPV test looks for the virus that can cause the cell changes on the cervix. The cells collected during the Pap test can be tested for HPV. The HPV test could be used to screen women aged 30   years and older, and should be used in women of any age who have unclear Pap test results. After the age of 30, women should have HPV testing at the same frequency as a Pap test.  Colorectal cancer can be detected and often prevented. Most routine colorectal cancer screening begins at the age of 50 and continues through age 75. However, your caregiver may recommend screening at an earlier age if you have risk factors for colon cancer. On a yearly basis, your caregiver may provide home test kits to check for hidden blood in the stool. Use of a small camera at the end of a tube, to directly examine the colon (sigmoidoscopy or colonoscopy), can detect the earliest forms of colorectal cancer. Talk to your caregiver about this at age 50, when routine screening begins. Direct examination of the colon should be repeated every 5 to 10 years through age 75, unless early forms of pre-cancerous polyps or small growths are found.  Hepatitis C blood testing is recommended for  all people born from 1945 through 1965 and any individual with known risks for hepatitis C.  Practice safe sex. Use condoms and avoid high-risk sexual practices to reduce the spread of sexually transmitted infections (STIs). Sexually active women aged 25 and younger should be checked for Chlamydia, which is a common sexually transmitted infection. Older women with new or multiple partners should also be tested for Chlamydia. Testing for other STIs is recommended if you are sexually active and at increased risk.  Osteoporosis is a disease in which the bones lose minerals and strength with aging. This can result in serious bone fractures. The risk of osteoporosis can be identified using a bone density scan. Women ages 65 and over and women at risk for fractures or osteoporosis should discuss screening with their caregivers. Ask your caregiver whether you should be taking a calcium supplement or vitamin D to reduce the rate of osteoporosis.  Menopause can be associated with physical symptoms and risks. Hormone replacement therapy is available to decrease symptoms and risks. You should talk to your caregiver about whether hormone replacement therapy is right for you.  Use sunscreen. Apply sunscreen liberally and repeatedly throughout the day. You should seek shade when your shadow is shorter than you. Protect yourself by wearing long sleeves, pants, a wide-brimmed hat, and sunglasses year round, whenever you are outdoors.  Notify your caregiver of new moles or changes in moles, especially if there is a change in shape or color. Also notify your caregiver if a mole is larger than the size of a pencil eraser.  Stay current with your immunizations. Document Released: 04/06/2011 Document Revised: 01/16/2013 Document Reviewed: 04/06/2011 Exit[employer] Patient Information 2014 [employer].   

## 2014-03-30 NOTE — Progress Notes (Signed)
Margaret Evans 1946/01/27 [id number]        68 y.o.  G1P1001 followup exam. Several issues that are below.  Past medical history,surgical history, problem list, medications, allergies, family history and social history were all reviewed and documented as reviewed in the EPIC chart.  ROS:  12 system ROS performed with pertinent positives and negatives included in the history, assessment and plan.   Additional significant findings :  None   Exam: Kim Counsellor Vitals:   03/30/14 1029  BP: 126/80  Height: 5\' 1"  (1.549 m)  Weight: 194 lb (87.998 kg)   General appearance:  Normal affect, orientation and appearance. Skin: Grossly normal HEENT: Without gross lesions.  No cervical or supraclavicular adenopathy. Thyroid normal.  Lungs:  Clear without wheezing, rales or rhonchi Cardiac: RR, without RMG Abdominal:  Soft, nontender, without masses, guarding, rebound, organomegaly or hernia Breasts:  Examined lying and sitting without masses, retractions, discharge or axillary adenopathy. Pelvic:  Ext/BUS/vagina with generalized atrophic changes. Pap of cuff done  Adnexa  Without masses or tenderness    Anus and perineum  Normal   Rectovaginal  Normal sphincter tone without palpated masses or tenderness.    Assessment/Plan:  68 y.o. G61P1001 female for followup.   1. 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 Exam today NED. Pap smear of cuff done. 2. Postmenopausal/atrophic genital changes. Patient doing well without significant hot flushes, night sweats, vaginal dryness or dyspareunia. Will continue to monitor. 3. Osteopenia. DEXA 08/2013 with T score -2.3. FRAX 11%/2.0%. Slight decline from prior study in the hips. Had tried Fosamax previously but did not tolerate the medication from a GI standpoint. Sister has had 2 atypical fractures on Fosamax previously. Prefers expectant management with repeat DEXA next year. Vitamin D 55 within the past  year. 4. Mammography 08/2013. Repeat mammogram this coming fall. SBE monthly reviewed. 5. Colonoscopy 2014. Repeat at their recommended interval. 6. Health maintenance. No blood work done as this is done through her primary physician's office. Followup one year, sooner as needed.   Note: This document was prepared with digital dictation and possible smart phrase technology. Any transcriptional errors that result from this process are unintentional.   Anastasio Auerbach MD, 11:16 AM 03/30/2014

## 2014-03-30 NOTE — Addendum Note (Signed)
Addended by: Nelva Nay on: 03/30/2014 11:23 AM   Modules accepted: Orders

## 2014-04-03 LAB — CYTOLOGY - PAP

## 2014-05-23 ENCOUNTER — Ambulatory Visit: Payer: Medicare Other | Admitting: Pulmonary Disease

## 2014-08-06 ENCOUNTER — Encounter: Payer: Self-pay | Admitting: Gynecology

## 2014-09-19 ENCOUNTER — Encounter: Payer: Self-pay | Admitting: Gynecology

## 2015-04-01 ENCOUNTER — Encounter: Payer: Self-pay | Admitting: Gynecology

## 2015-05-23 ENCOUNTER — Other Ambulatory Visit (HOSPITAL_COMMUNITY)
Admission: RE | Admit: 2015-05-23 | Discharge: 2015-05-23 | Disposition: A | Discharge status: Home / Self Care | Payer: Medicare Other | Source: Ambulatory Visit | Attending: Gynecology | Admitting: Gynecology

## 2015-05-23 ENCOUNTER — Ambulatory Visit (INDEPENDENT_AMBULATORY_CARE_PROVIDER_SITE_OTHER): Payer: Medicare Other | Admitting: Gynecology

## 2015-05-23 ENCOUNTER — Encounter: Payer: Self-pay | Admitting: Gynecology

## 2015-05-23 VITALS — BP 124/80 | Ht 61.0 in | Wt 196.6 lb

## 2015-05-23 DIAGNOSIS — Z124 Encounter for screening for malignant neoplasm of cervix: Secondary | ICD-10-CM | POA: Insufficient documentation

## 2015-05-23 DIAGNOSIS — C541 Malignant neoplasm of endometrium: Secondary | ICD-10-CM

## 2015-05-23 DIAGNOSIS — Z01419 Encounter for gynecological examination (general) (routine) without abnormal findings: Secondary | ICD-10-CM | POA: Diagnosis not present

## 2015-05-23 DIAGNOSIS — N952 Postmenopausal atrophic vaginitis: Secondary | ICD-10-CM | POA: Diagnosis not present

## 2015-05-23 DIAGNOSIS — M8580 Other specified disorders of bone density and structure, unspecified site: Secondary | ICD-10-CM

## 2015-05-23 NOTE — Progress Notes (Signed)
Margaret Evans Aug 21, 1946 [id number]        69 y.o.  G1P1001 for breast and pelvic exam.  Past medical history,surgical history, problem list, medications, allergies, family history and social history were all reviewed and documented as reviewed in the EPIC chart.  ROS:  Performed with pertinent positives and negatives included in the history, assessment and plan.   Additional significant findings :  none   Exam: Kim Counsellor Vitals:   05/23/15 1045  BP: 124/80  Height: 5\' 1"  (1.549 m)  Weight: 196 lb 9.6 oz (89.177 kg)   General appearance:  Normal affect, orientation and appearance. Skin: Grossly normal HEENT: Without gross lesions.  No cervical or supraclavicular adenopathy. Thyroid normal.  Lungs:  Clear without wheezing, rales or rhonchi Cardiac: RR, without RMG Abdominal:  Soft, nontender, without masses, guarding, rebound, organomegaly or hernia Breasts:  Examined lying and sitting without masses, retractions, discharge or axillary adenopathy. Pelvic:  Ext/BUS/vagina with atrophic changes. Pap smear of cuff done  Adnexa  Without masses or tenderness    Anus and perineum  Normal   Rectovaginal  Normal sphincter tone without palpated masses or tenderness.    Assessment/Plan:  69 y.o. G21P1001 female for breast and pelvic exam.   1. 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 Doing well without significant hot flashes or night sweats or vaginal dryness. Continue to monitor. Pap smear of cuff done today. 2. Osteopenia. DEXA 2014 T score -2.3 FRAX 11%/2%. Repeat DEXA now and patient will schedule. She has tried Fosamax transiently but discontinued due to GI side effects. She has 2 sisters who took Fosamax and have atypical fractures. Patient has declined treatment in the past. 3. Mammography 09/2014. Repeat this coming December. SBE monthly reviewed. 4. Colonoscopy 2014. Repeat at their recommended interval. 5. Health maintenance.  No routine blood work done as this is done at her primary physician's office. Follow up 1 year, sooner as needed.   Anastasio Auerbach MD, 11:08 AM 05/23/2015

## 2015-05-23 NOTE — Addendum Note (Signed)
Addended by: Nelva Nay on: 05/23/2015 11:27 AM   Modules accepted: Orders

## 2015-05-23 NOTE — Patient Instructions (Signed)
Follow up for bone density as scheduled.  You may obtain a copy of any labs that were done today by logging onto MyChart as outlined in the instructions provided with your AVS (after visit summary). The office will not call with normal lab results but certainly if there are any significant abnormalities then we will contact you.   Health Maintenance Adopting a healthy lifestyle and getting preventive care can go a long way to promote health and wellness. Talk with your health care provider about what schedule of regular examinations is right for you. This is a good chance for you to check in with your provider about disease prevention and staying healthy. In between checkups, there are plenty of things you can do on your own. Experts have done a lot of research about which lifestyle changes and preventive measures are most likely to keep you healthy. Ask your health care provider for more information. WEIGHT AND DIET  Eat a healthy diet  Be sure to include plenty of vegetables, fruits, low-fat dairy products, and lean protein.  Do not eat a lot of foods high in solid fats, added sugars, or salt.  Get regular exercise. This is one of the most important things you can do for your health.  Most adults should exercise for at least 150 minutes each week. The exercise should increase your heart rate and make you sweat (moderate-intensity exercise).  Most adults should also do strengthening exercises at least twice a week. This is in addition to the moderate-intensity exercise.  Maintain a healthy weight  Body mass index (BMI) is a measurement that can be used to identify possible weight problems. It estimates body fat based on height and weight. Your health care provider can help determine your BMI and help you achieve or maintain a healthy weight.  For females 69 years of age and older:   A BMI below 18.5 is considered underweight.  A BMI of 18.5 to 24.9 is normal.  A BMI of 25 to 29.9 is  considered overweight.  A BMI of 30 and above is considered obese.  Watch levels of cholesterol and blood lipids  You should start having your blood tested for lipids and cholesterol at 70 years of age, then have this test every 5 years.  You may need to have your cholesterol levels checked more often if:  Your lipid or cholesterol levels are high.  You are older than 70 years of age.  You are at high risk for heart disease.  CANCER SCREENING   Lung Cancer  Lung cancer screening is recommended for adults 30-82 years old who are at high risk for lung cancer because of a history of smoking.  A yearly low-dose CT scan of the lungs is recommended for people who:  Currently smoke.  Have quit within the past 15 years.  Have at least a 30-pack-year history of smoking. A pack year is smoking an average of one pack of cigarettes a day for 1 year.  Yearly screening should continue until it has been 15 years since you quit.  Yearly screening should stop if you develop a health problem that would prevent you from having lung cancer treatment.  Breast Cancer  Practice breast self-awareness. This means understanding how your breasts normally appear and feel.  It also means doing regular breast self-exams. Let your health care provider know about any changes, no matter how small.  If you are in your 20s or 30s, you should have a clinical breast exam (  CBE) by a health care provider every 1-3 years as part of a regular health exam.  If you are 56 or older, have a CBE every year. Also consider having a breast X-ray (mammogram) every year.  If you have a family history of breast cancer, talk to your health care provider about genetic screening.  If you are at high risk for breast cancer, talk to your health care provider about having an MRI and a mammogram every year.  Breast cancer gene (BRCA) assessment is recommended for women who have family members with BRCA-related cancers.  BRCA-related cancers include:  Breast.  Ovarian.  Tubal.  Peritoneal cancers.  Results of the assessment will determine the need for genetic counseling and BRCA1 and BRCA2 testing. Cervical Cancer Routine pelvic examinations to screen for cervical cancer are no longer recommended for nonpregnant women who are considered low risk for cancer of the pelvic organs (ovaries, uterus, and vagina) and who do not have symptoms. A pelvic examination may be necessary if you have symptoms including those associated with pelvic infections. Ask your health care provider if a screening pelvic exam is right for you.   The Pap test is the screening test for cervical cancer for women who are considered at risk.  If you had a hysterectomy for a problem that was not cancer or a condition that could lead to cancer, then you no longer need Pap tests.  If you are older than 65 years, and you have had normal Pap tests for the past 10 years, you no longer need to have Pap tests.  If you have had past treatment for cervical cancer or a condition that could lead to cancer, you need Pap tests and screening for cancer for at least 20 years after your treatment.  If you no longer get a Pap test, assess your risk factors if they change (such as having a new sexual partner). This can affect whether you should start being screened again.  Some women have medical problems that increase their chance of getting cervical cancer. If this is the case for you, your health care provider may recommend more frequent screening and Pap tests.  The human papillomavirus (HPV) test is another test that may be used for cervical cancer screening. The HPV test looks for the virus that can cause cell changes in the cervix. The cells collected during the Pap test can be tested for HPV.  The HPV test can be used to screen women 39 years of age and older. Getting tested for HPV can extend the interval between normal Pap tests from three to  five years.  An HPV test also should be used to screen women of any age who have unclear Pap test results.  After 70 years of age, women should have HPV testing as often as Pap tests.  Colorectal Cancer  This type of cancer can be detected and often prevented.  Routine colorectal cancer screening usually begins at 70 years of age and continues through 70 years of age.  Your health care provider may recommend screening at an earlier age if you have risk factors for colon cancer.  Your health care provider may also recommend using home test kits to check for hidden blood in the stool.  A small camera at the end of a tube can be used to examine your colon directly (sigmoidoscopy or colonoscopy). This is done to check for the earliest forms of colorectal cancer.  Routine screening usually begins at age 77.  Direct examination of the colon should be repeated every 5-10 years through 70 years of age. However, you may need to be screened more often if early forms of precancerous polyps or small growths are found. Skin Cancer  Check your skin from head to toe regularly.  Tell your health care provider about any new moles or changes in moles, especially if there is a change in a mole's shape or color.  Also tell your health care provider if you have a mole that is larger than the size of a pencil eraser.  Always use sunscreen. Apply sunscreen liberally and repeatedly throughout the day.  Protect yourself by wearing long sleeves, pants, a wide-brimmed hat, and sunglasses whenever you are outside. HEART DISEASE, DIABETES, AND HIGH BLOOD PRESSURE   Have your blood pressure checked at least every 1-2 years. High blood pressure causes heart disease and increases the risk of stroke.  If you are between 76 years and 55 years old, ask your health care provider if you should take aspirin to prevent strokes.  Have regular diabetes screenings. This involves taking a blood sample to check your  fasting blood sugar level.  If you are at a normal weight and have a low risk for diabetes, have this test once every three years after 70 years of age.  If you are overweight and have a high risk for diabetes, consider being tested at a younger age or more often. PREVENTING INFECTION  Hepatitis B  If you have a higher risk for hepatitis B, you should be screened for this virus. You are considered at high risk for hepatitis B if:  You were born in a country where hepatitis B is common. Ask your health care provider which countries are considered high risk.  Your parents were born in a high-risk country, and you have not been immunized against hepatitis B (hepatitis B vaccine).  You have HIV or AIDS.  You use needles to inject street drugs.  You live with someone who has hepatitis B.  You have had sex with someone who has hepatitis B.  You get hemodialysis treatment.  You take certain medicines for conditions, including cancer, organ transplantation, and autoimmune conditions. Hepatitis C  Blood testing is recommended for:  Everyone born from [birth year] through 1966.  Anyone with known risk factors for hepatitis C. Sexually transmitted infections (STIs)  You should be screened for sexually transmitted infections (STIs) including gonorrhea and chlamydia if:  You are sexually active and are younger than 70 years of age.  You are older than 70 years of age and your health care provider tells you that you are at risk for this type of infection.  Your sexual activity has changed since you were last screened and you are at an increased risk for chlamydia or gonorrhea. Ask your health care provider if you are at risk.  If you do not have HIV, but are at risk, it may be recommended that you take a prescription medicine daily to prevent HIV infection. This is called pre-exposure prophylaxis (PrEP). You are considered at risk if:  You are sexually active and do not regularly use condoms or  know the HIV status of your partner(s).  You take drugs by injection.  You are sexually active with a partner who has HIV. Talk with your health care provider about whether you are at high risk of being infected with HIV. If you choose to begin PrEP, you should first be tested for HIV. You should then be  tested every 3 months for as long as you are taking PrEP.  PREGNANCY   If you are premenopausal and you may become pregnant, ask your health care provider about preconception counseling.  If you may become pregnant, take 400 to 800 micrograms (mcg) of folic acid every day.  If you want to prevent pregnancy, talk to your health care provider about birth control (contraception). OSTEOPOROSIS AND MENOPAUSE   Osteoporosis is a disease in which the bones lose minerals and strength with aging. This can result in serious bone fractures. Your risk for osteoporosis can be identified using a bone density scan.  If you are 25 years of age or older, or if you are at risk for osteoporosis and fractures, ask your health care provider if you should be screened.  Ask your health care provider whether you should take a calcium or vitamin D supplement to lower your risk for osteoporosis.  Menopause may have certain physical symptoms and risks.  Hormone replacement therapy may reduce some of these symptoms and risks. Talk to your health care provider about whether hormone replacement therapy is right for you.  HOME CARE INSTRUCTIONS   Schedule regular health, dental, and eye exams.  Stay current with your immunizations.   Do not use any tobacco products including cigarettes, chewing tobacco, or electronic cigarettes.  If you are pregnant, do not drink alcohol.  If you are breastfeeding, limit how much and how often you drink alcohol.  Limit alcohol intake to no more than 1 drink per day for nonpregnant women. One drink equals 12 ounces of beer, 5 ounces of wine, or 1 ounces of hard liquor.  Do  not use street drugs.  Do not share needles.  Ask your health care provider for help if you need support or information about quitting drugs.  Tell your health care provider if you often feel depressed.  Tell your health care provider if you have ever been abused or do not feel safe at home. Document Released: 04/06/2011 Document Revised: 02/05/2014 Document Reviewed: 08/23/2013 [facility] Patient Information 2015 [place], [place]. This information is not intended to replace advice given to you by your health care provider. Make sure you discuss any questions you have with your health care provider.

## 2015-05-24 LAB — URINALYSIS W MICROSCOPIC + REFLEX CULTURE
BACTERIA UA: NONE SEEN [HPF]
Bilirubin Urine: NEGATIVE
CASTS: NONE SEEN [LPF]
Crystals: NONE SEEN [HPF]
GLUCOSE, UA: NEGATIVE
HGB URINE DIPSTICK: NEGATIVE
KETONES UR: NEGATIVE
LEUKOCYTES UA: NEGATIVE
NITRITE: NEGATIVE
PH: 7 (ref 5.0–8.0)
PROTEIN: NEGATIVE
RBC / HPF: NONE SEEN RBC/HPF (ref <=2)
Specific Gravity, Urine: 1.015 (ref 1.001–1.035)
Squamous Epithelial / LPF: NONE SEEN [HPF] (ref <=5)
WBC, UA: NONE SEEN WBC/HPF (ref <=5)
YEAST: NONE SEEN [HPF]

## 2015-05-24 LAB — CYTOLOGY - PAP

## 2015-08-06 DIAGNOSIS — M8580 Other specified disorders of bone density and structure, unspecified site: Secondary | ICD-10-CM

## 2015-08-06 HISTORY — DX: Other specified disorders of bone density and structure, unspecified site: M85.80

## 2015-09-03 ENCOUNTER — Other Ambulatory Visit: Payer: Self-pay | Admitting: Gynecology

## 2015-09-03 ENCOUNTER — Ambulatory Visit (INDEPENDENT_AMBULATORY_CARE_PROVIDER_SITE_OTHER): Payer: Medicare Other

## 2015-09-03 ENCOUNTER — Encounter: Payer: Self-pay | Admitting: Gynecology

## 2015-09-03 DIAGNOSIS — M8580 Other specified disorders of bone density and structure, unspecified site: Secondary | ICD-10-CM

## 2015-09-03 DIAGNOSIS — M899 Disorder of bone, unspecified: Secondary | ICD-10-CM | POA: Diagnosis not present

## 2015-09-03 DIAGNOSIS — M8589 Other specified disorders of bone density and structure, multiple sites: Secondary | ICD-10-CM

## 2015-09-26 ENCOUNTER — Encounter: Payer: Self-pay | Admitting: Gynecology

## 2016-08-17 ENCOUNTER — Encounter: Payer: Self-pay | Admitting: Gynecology

## 2016-08-17 ENCOUNTER — Ambulatory Visit (INDEPENDENT_AMBULATORY_CARE_PROVIDER_SITE_OTHER): Payer: Medicare Other | Admitting: Gynecology

## 2016-08-17 VITALS — BP 122/76 | Ht 61.0 in | Wt 185.0 lb

## 2016-08-17 DIAGNOSIS — C541 Malignant neoplasm of endometrium: Secondary | ICD-10-CM | POA: Diagnosis not present

## 2016-08-17 DIAGNOSIS — Z01411 Encounter for gynecological examination (general) (routine) with abnormal findings: Secondary | ICD-10-CM

## 2016-08-17 DIAGNOSIS — N952 Postmenopausal atrophic vaginitis: Secondary | ICD-10-CM

## 2016-08-17 NOTE — Patient Instructions (Signed)

## 2016-08-17 NOTE — Progress Notes (Signed)
    Margaret Evans 06/10/1946 [id number]        70 y.o.  G1P1001  for annual exam.   Past medical history,surgical history, problem list, medications, allergies, family history and social history were all reviewed and documented as reviewed in the EPIC chart.  ROS:  Performed with pertinent positives and negatives included in the history, assessment and plan.   Additional significant findings :  None   Exam: Caryn Bee assistant Vitals:   08/17/16 1049  BP: 122/76  Weight: 185 lb (83.9 kg)  Height: 5\' 1"  (1.549 m)   Body mass index is 34.96 kg/m.  General appearance:  Normal affect, orientation and appearance. Skin: Grossly normal HEENT: Without gross lesions.  No cervical or supraclavicular adenopathy. Thyroid normal.  Lungs:  Clear without wheezing, rales or rhonchi Cardiac: RR, without RMG Abdominal:  Soft, nontender, without masses, guarding, rebound, organomegaly or hernia Breasts:  Examined lying and sitting without masses, retractions, discharge or axillary adenopathy. Pelvic:  Ext, BUS, Vagina with atrophic changes. Pap smear cuff done  Adnexa without masses or tenderness    Anus and perineum normal   Rectovaginal normal sphincter tone without palpated masses or tenderness.    Assessment/Plan:  70 y.o. G12P1001 female for annual exam.   1. 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 Doing well without complaints. No significant hot flushes, night sweats or vaginal dryness. Exam NED. Pap smear cuff done today. I did review with her current thinking as far as not doing Pap smears on patients with a history of adenocarcinoma the endometrium. 2. Osteopenia. DEXA 08/2015 T score -2.2 FRAX 12%/2.5%. Stable from prior DEXA. Had transiently tried Fosamax in the past but discontinued due to GI side effects. Also has 2 sisters with atypical fractures on Fosamax. Patient is comfortable with observation this point we'll plan on repeating  her DEXA next year at 3 year interval. 3. Mammography coming due in December. Patient will schedule follow up for this. SBE monthly reviewed. 4. Colonoscopy 2014. Repeat at their recommended interval. SBE monthly reviewed 5. Health maintenance. No routine lab work done as this is done elsewhere. Follow up in one year, sooner as needed.   Anastasio Auerbach MD, 11:32 AM 08/17/2016

## 2016-08-17 NOTE — Addendum Note (Signed)
Addended by: Nelva Nay on: 08/17/2016 12:16 PM   Modules accepted: Orders

## 2016-08-21 LAB — PAP IG W/ RFLX HPV ASCU

## 2017-06-02 ENCOUNTER — Other Ambulatory Visit: Payer: Self-pay | Admitting: *Deleted

## 2017-06-02 DIAGNOSIS — M8580 Other specified disorders of bone density and structure, unspecified site: Secondary | ICD-10-CM

## 2017-08-18 ENCOUNTER — Encounter: Payer: Medicare Other | Admitting: Gynecology

## 2017-09-07 ENCOUNTER — Other Ambulatory Visit: Payer: Self-pay | Admitting: Gynecology

## 2017-09-07 ENCOUNTER — Ambulatory Visit (INDEPENDENT_AMBULATORY_CARE_PROVIDER_SITE_OTHER): Payer: Medicare Other

## 2017-09-07 DIAGNOSIS — Z780 Asymptomatic menopausal state: Secondary | ICD-10-CM

## 2017-09-07 DIAGNOSIS — M8580 Other specified disorders of bone density and structure, unspecified site: Secondary | ICD-10-CM

## 2017-09-07 DIAGNOSIS — M8589 Other specified disorders of bone density and structure, multiple sites: Secondary | ICD-10-CM

## 2017-09-08 ENCOUNTER — Encounter: Payer: Self-pay | Admitting: Gynecology

## 2017-10-11 ENCOUNTER — Ambulatory Visit (INDEPENDENT_AMBULATORY_CARE_PROVIDER_SITE_OTHER): Payer: Medicare Other | Admitting: Gynecology

## 2017-10-11 ENCOUNTER — Encounter: Payer: Self-pay | Admitting: Gynecology

## 2017-10-11 VITALS — BP 118/76 | Ht 60.0 in | Wt 184.0 lb

## 2017-10-11 DIAGNOSIS — C541 Malignant neoplasm of endometrium: Secondary | ICD-10-CM | POA: Diagnosis not present

## 2017-10-11 DIAGNOSIS — Z01411 Encounter for gynecological examination (general) (routine) with abnormal findings: Secondary | ICD-10-CM | POA: Diagnosis not present

## 2017-10-11 DIAGNOSIS — N952 Postmenopausal atrophic vaginitis: Secondary | ICD-10-CM | POA: Diagnosis not present

## 2017-10-11 DIAGNOSIS — M8580 Other specified disorders of bone density and structure, unspecified site: Secondary | ICD-10-CM | POA: Diagnosis not present

## 2017-10-11 DIAGNOSIS — Z1272 Encounter for screening for malignant neoplasm of vagina: Secondary | ICD-10-CM | POA: Diagnosis not present

## 2017-10-11 NOTE — Progress Notes (Signed)
    Margaret Evans 1946-08-08 [id number]        72 y.o.  G1P1001 for annual gynecologic exam.  Doing well without gynecologic complaints  Past medical history,surgical history, problem list, medications, allergies, family history and social history were all reviewed and documented as reviewed in the EPIC chart.  ROS:  Performed with pertinent positives and negatives included in the history, assessment and plan.   Additional significant findings : None   Exam: Caryn Bee assistant Vitals:   10/11/17 1410  BP: 118/76  Weight: 184 lb (83.5 kg)  Height: 5' (1.524 m)   Body mass index is 35.94 kg/m.  General appearance:  Normal affect, orientation and appearance. Skin: Grossly normal HEENT: Without gross lesions.  No cervical or supraclavicular adenopathy. Thyroid normal.  Lungs:  Clear without wheezing, rales or rhonchi Cardiac: RR, without RMG Abdominal:  Soft, nontender, without masses, guarding, rebound, organomegaly or hernia Breasts:  Examined lying and sitting without masses, retractions, discharge or axillary adenopathy. Pelvic:  Ext, BUS, Vagina: With atrophic changes.  Pap smear of vaginal cuff done  Adnexa: Without masses or tenderness    Anus and perineum: Normal   Rectovaginal: Normal sphincter tone without palpated masses or tenderness.    Assessment/Plan:  72 y.o. G60P1001 female for annual gynecologic exam.   1. History of stage Ia well-differentiated adenocarcinoma of the endometrium.  Status post LAVH BSO 1996.  Doing well without complaints.  Pap smear of vaginal cuff done.  We did review options not to continue with annual cytology per current recommendations in patients with history of uterine cancer. 2. Mammography 09/2017.  Continue with annual mammography next year.  Breast exam normal today. 3. Osteopenia.  DEXA 09/2017 T score -2.3 FRAX 9.5% / 1.8%.  Stable from prior DEXA.  History of transient Fosamax in the past but discontinued a number of years ago.   Has 2 sisters that had atypical fractures on Fosamax.  Plan expectant management with follow-up DEXA in 2 years. 4. Colonoscopy 2014.  Repeat at their recommended interval. 5. Health maintenance.  No routine lab work done as patient does this elsewhere.  Follow-up 1 year, sooner as needed.   Anastasio Auerbach MD, 3:15 PM 10/11/2017

## 2017-10-11 NOTE — Addendum Note (Signed)
Addended by: Nelva Nay on: 10/11/2017 03:24 PM   Modules accepted: Orders

## 2017-10-11 NOTE — Patient Instructions (Signed)
Follow-up in 1 year, sooner as needed. 

## 2017-10-12 LAB — PAP IG W/ RFLX HPV ASCU

## 2018-02-22 ENCOUNTER — Encounter: Payer: Self-pay | Admitting: Gastroenterology

## 2018-10-12 ENCOUNTER — Encounter: Payer: Self-pay | Admitting: Gynecology

## 2018-10-12 ENCOUNTER — Ambulatory Visit (INDEPENDENT_AMBULATORY_CARE_PROVIDER_SITE_OTHER): Payer: Medicare Other | Admitting: Gynecology

## 2018-10-12 VITALS — BP 134/86 | Ht 60.5 in | Wt 183.0 lb

## 2018-10-12 DIAGNOSIS — C541 Malignant neoplasm of endometrium: Secondary | ICD-10-CM

## 2018-10-12 DIAGNOSIS — N952 Postmenopausal atrophic vaginitis: Secondary | ICD-10-CM | POA: Diagnosis not present

## 2018-10-12 DIAGNOSIS — Z01419 Encounter for gynecological examination (general) (routine) without abnormal findings: Secondary | ICD-10-CM

## 2018-10-12 DIAGNOSIS — Z8542 Personal history of malignant neoplasm of other parts of uterus: Secondary | ICD-10-CM | POA: Diagnosis not present

## 2018-10-12 DIAGNOSIS — M8580 Other specified disorders of bone density and structure, unspecified site: Secondary | ICD-10-CM | POA: Diagnosis not present

## 2018-10-12 NOTE — Patient Instructions (Signed)
Follow-up in 1 year, sooner as needed. 

## 2018-10-12 NOTE — Progress Notes (Signed)
    Margaret Evans [birth year] [id number]        73 y.o.  G1P1001 for annual gynecologic exam.  Without gynecologic complaints  Past medical history,surgical history, problem list, medications, allergies, family history and social history were all reviewed and documented as reviewed in the EPIC chart.  ROS:  Performed with pertinent positives and negatives included in the history, assessment and plan.   Additional significant findings : None   Exam: Caryn Bee assistant Vitals:   10/12/18 1049  BP: 134/86  Weight: 183 lb (83 kg)  Height: 5' 0.5" (1.537 m)   Body mass index is 35.15 kg/m.  General appearance:  Normal affect, orientation and appearance. Skin: Grossly normal HEENT: Without gross lesions.  No cervical or supraclavicular adenopathy. Thyroid normal.  Lungs:  Clear without wheezing, rales or rhonchi Cardiac: RR, without RMG Abdominal:  Soft, nontender, without masses, guarding, rebound, organomegaly or hernia Breasts:  Examined lying and sitting without masses, retractions, discharge or axillary adenopathy. Pelvic:  Ext, BUS, Vagina: With atrophic changes  Adnexa: Without masses or tenderness    Anus and perineum: Normal   Rectovaginal: Normal sphincter tone without palpated masses or tenderness.    Assessment/Plan:  73 y.o. G12P1001 female for annual gynecologic exam.   1. Postmenopausal.  No significant menopausal symptoms. 2. History of stage Ia well-differentiated adenocarcinoma of the endometrium status post LAVH BSO 1996.  Exam NED.  Pap smears have all been normal since.  We discussed the more recent recommendations to stop doing Pap smears as screening after adenocarcinoma of the endometrium.  She is comfortable with this and we did not do a Pap smear today and agreed to stop screening. 3. Mammography today.  Breast exam normal. 4. Colonoscopy 2019.  Repeat at their recommended interval. 5. Osteopenia.  DEXA 09/2017 T score -2.3 FRAX 9.5% / 1.8%.  Stable from  prior DEXA.  Plan repeat DEXA next year at 2-year interval.  Had transiently tried Fosamax but discontinued.  She has 2 sisters that had atypical fractures on Fosamax. 6. Health maintenance.  No routine lab work done as patient does this elsewhere.  Follow-up 1 year, sooner as needed.   Anastasio Auerbach MD, 11:08 AM 10/12/2018

## 2019-07-13 ENCOUNTER — Encounter: Payer: Self-pay | Admitting: Gynecology

## 2019-10-16 ENCOUNTER — Other Ambulatory Visit: Payer: Self-pay | Admitting: *Deleted

## 2019-10-16 DIAGNOSIS — M8580 Other specified disorders of bone density and structure, unspecified site: Secondary | ICD-10-CM

## 2019-10-18 ENCOUNTER — Encounter: Payer: Medicare Other | Admitting: Gynecology

## 2019-10-25 ENCOUNTER — Other Ambulatory Visit: Payer: Self-pay

## 2019-10-26 ENCOUNTER — Ambulatory Visit (INDEPENDENT_AMBULATORY_CARE_PROVIDER_SITE_OTHER): Payer: Medicare Other

## 2019-10-26 ENCOUNTER — Other Ambulatory Visit: Payer: Self-pay | Admitting: Obstetrics & Gynecology

## 2019-10-26 DIAGNOSIS — M810 Age-related osteoporosis without current pathological fracture: Secondary | ICD-10-CM

## 2019-10-26 DIAGNOSIS — M8580 Other specified disorders of bone density and structure, unspecified site: Secondary | ICD-10-CM

## 2019-10-26 DIAGNOSIS — Z780 Asymptomatic menopausal state: Secondary | ICD-10-CM

## 2019-11-02 ENCOUNTER — Ambulatory Visit: Payer: Medicare Other

## 2019-11-02 ENCOUNTER — Encounter: Payer: Self-pay | Admitting: *Deleted

## 2019-11-03 ENCOUNTER — Encounter: Payer: Medicare Other | Admitting: Obstetrics and Gynecology

## 2019-11-07 ENCOUNTER — Ambulatory Visit: Payer: Medicare Other | Admitting: Obstetrics and Gynecology

## 2019-11-11 ENCOUNTER — Ambulatory Visit: Payer: Medicare Other

## 2019-11-17 ENCOUNTER — Other Ambulatory Visit: Payer: Self-pay

## 2019-11-20 ENCOUNTER — Encounter: Payer: Self-pay | Admitting: Obstetrics and Gynecology

## 2019-11-20 ENCOUNTER — Ambulatory Visit (INDEPENDENT_AMBULATORY_CARE_PROVIDER_SITE_OTHER): Payer: Medicare Other | Admitting: Obstetrics and Gynecology

## 2019-11-20 ENCOUNTER — Other Ambulatory Visit: Payer: Self-pay

## 2019-11-20 VITALS — BP 124/80 | Ht 61.0 in | Wt 186.0 lb

## 2019-11-20 DIAGNOSIS — M810 Age-related osteoporosis without current pathological fracture: Secondary | ICD-10-CM | POA: Diagnosis not present

## 2019-11-20 DIAGNOSIS — Z8542 Personal history of malignant neoplasm of other parts of uterus: Secondary | ICD-10-CM | POA: Diagnosis not present

## 2019-11-20 DIAGNOSIS — Z01419 Encounter for gynecological examination (general) (routine) without abnormal findings: Secondary | ICD-10-CM

## 2019-11-20 NOTE — Progress Notes (Signed)
Margaret Evans Oct 15, 1945 [id number]  SUBJECTIVE:  74 y.o. G1P1001 female for annual routine gynecologic exam and Pap smear. She has no gynecologic concerns.  Current Outpatient Medications  Medication Sig Dispense Refill  . aspirin 81 MG tablet Take 81 mg by mouth daily.     . Calcium Carbonate-Vitamin D 600-400 MG-UNIT per tablet Take 1 tablet by mouth 2 (two) times daily.      . Cholecalciferol (VITAMIN D) 1000 UNITS capsule Take 1,000 Units by mouth daily.      . Multiple Vitamin (MULTIVITAMIN) tablet Take 1 tablet by mouth daily.      . simvastatin (ZOCOR) 40 MG tablet Take 1 tablet (40 mg total) by mouth at bedtime. 90 tablet 1   No current facility-administered medications for this visit.   Allergies: Alendronate sodium and Losartan potassium  No LMP recorded. Patient has had a hysterectomy.  Past medical history,surgical history, problem list, medications, allergies, family history and social history were all reviewed and documented as reviewed in the EPIC chart.  ROS:  Feeling well. No dyspnea or chest pain on exertion.  No abdominal pain, change in bowel habits, black or bloody stools.  No urinary tract symptoms. GYN ROS: no abnormal bleeding, pelvic pain or discharge, no breast pain or new or enlarging lumps on self exam. No neurological complaints.   OBJECTIVE:  Ht 5\' 1"  ([id number] m)   Wt 186 lb (84.4 kg)   BMI 35.14 kg/m  The patient appears well, alert, oriented x 3, in no distress. ENT normal.  Neck supple. No cervical or supraclavicular adenopathy or thyromegaly.  Lungs are clear, good air entry, no wheezes, rhonchi or rales. S1 and S2 normal, no murmurs, regular rate and rhythm.  Abdomen soft without tenderness, guarding, mass or organomegaly.  Neurological is normal, no focal findings.  BREAST EXAM: breasts appear normal, no suspicious masses, no skin or nipple changes or axillary nodes  PELVIC EXAM: VULVA: normal appearing vulva with no masses, tenderness or  lesions, VAGINA: normal appearing vagina with normal color and discharge, no lesions, CERVIX: surgically absent, NED, UTERUS: uterus is normal size, shape, consistency and nontender, ADNEXA: nontender and no masses  Chaperone: Margaret Evans present during the examination  ASSESSMENT:  74 y.o. G1P1001 here for annual gynecologic exam  PLAN:   1. Postmenopausal. No HRT, no concerns, no vaginal bleeding. 2. History of stage Ia well-differentiated adenocarcinoma of the endometrium status post LAVH BSO 1996.  Exam NED.  Pap smears have all been normal since.  We discussed the more recent recommendations to stop doing Pap smears as screening after adenocarcinoma of the endometrium.  She is comfortable with this and we did not do a Pap smear today and agreed to stop screening. 3. Mammogram 10/2019. Will continue with annual mammography. Breast exam normal today. 4. Colonoscopy 2019. Recommended that she continue per the prescribed interval.   5. Osteoporosis.  Recent DEXA 10/2019 T score -2.5 (right femoral neck) demonstrating bone loss from prior DEXA.   Had transiently tried Fosamax but discontinued.  Recommendation is to consider restarting therapy, but she would rather not at this time for fear of side effects.  Other oral and injectable options reviewed.  Cautious weight bearing exercise and continued calcium and vitamin D are recommended.  She has 2 sisters that had atypical fractures on Fosamax.  Recommend repeating DEXA in 2023 / 2 years. 6. Health maintenance.  No lab work as she has this completed with her primary care provider.  Return annually or sooner, prn.  Margaret Pierini MD  11/20/19

## 2019-11-20 NOTE — Patient Instructions (Signed)
Please consider therapy for the osteoporosis, but if you would rather just cautiously take on more regular weight bearing exercise such as walking and continue vitamin D and calcium supplement for now, we will plan to repeat the bone density scan in 2023.
